# Patient Record
Sex: Female | Born: 1945
Health system: Southern US, Community
[De-identification: ages and names within clinical notes are randomized; demographics above are authoritative.]

## PROBLEM LIST (undated history)

## (undated) DIAGNOSIS — E039 Hypothyroidism, unspecified: Secondary | ICD-10-CM

## (undated) DIAGNOSIS — M810 Age-related osteoporosis without current pathological fracture: Secondary | ICD-10-CM

## (undated) DIAGNOSIS — E782 Mixed hyperlipidemia: Secondary | ICD-10-CM

## (undated) DIAGNOSIS — K2100 Gastro-esophageal reflux disease with esophagitis, without bleeding: Secondary | ICD-10-CM

## (undated) DIAGNOSIS — E038 Other specified hypothyroidism: Secondary | ICD-10-CM

## (undated) DIAGNOSIS — E1169 Type 2 diabetes mellitus with other specified complication: Secondary | ICD-10-CM

## (undated) HISTORY — DX: Type 2 diabetes mellitus with other specified complication: E11.69

## (undated) HISTORY — DX: Hypothyroidism, unspecified: E03.9

## (undated) HISTORY — DX: Other specified hypothyroidism: E03.8

## (undated) HISTORY — PX: CERVICAL DISC SURGERY: SHX588

## (undated) HISTORY — DX: Mixed hyperlipidemia: E78.2

## (undated) HISTORY — DX: Gastro-esophageal reflux disease with esophagitis, without bleeding: K21.00

## (undated) HISTORY — DX: Age-related osteoporosis without current pathological fracture: M81.0

## (undated) HISTORY — PX: PARTIAL HYSTERECTOMY: SHX80

---

## 2003-02-23 ENCOUNTER — Other Ambulatory Visit: Admission: RE | Admit: 2003-02-23 | Discharge: 2003-02-23 | Payer: Self-pay | Admitting: Obstetrics & Gynecology

## 2009-05-04 ENCOUNTER — Encounter: Admission: RE | Admit: 2009-05-04 | Discharge: 2009-05-04 | Payer: Self-pay | Admitting: Neurosurgery

## 2010-03-01 ENCOUNTER — Ambulatory Visit (HOSPITAL_COMMUNITY)
Admission: RE | Admit: 2010-03-01 | Discharge: 2010-03-01 | Disposition: A | Discharge status: Home / Self Care | Payer: Medicare Other | Source: Ambulatory Visit | Attending: Neurosurgery | Admitting: Neurosurgery

## 2010-03-01 ENCOUNTER — Encounter (HOSPITAL_COMMUNITY)
Admission: RE | Admit: 2010-03-01 | Discharge: 2010-03-01 | Disposition: A | Discharge status: Home / Self Care | Payer: Medicare Other | Source: Ambulatory Visit | Attending: Neurosurgery | Admitting: Neurosurgery

## 2010-03-01 ENCOUNTER — Other Ambulatory Visit: Payer: Self-pay | Admitting: Neurosurgery

## 2010-03-01 DIAGNOSIS — Z01811 Encounter for preprocedural respiratory examination: Secondary | ICD-10-CM

## 2010-03-01 DIAGNOSIS — Z01818 Encounter for other preprocedural examination: Secondary | ICD-10-CM | POA: Insufficient documentation

## 2010-03-01 LAB — CBC
HCT: 42.3 % (ref 36.0–46.0)
Hemoglobin: 14.1 g/dL (ref 12.0–15.0)
MCH: 30.9 pg (ref 26.0–34.0)
MCHC: 33.3 g/dL (ref 30.0–36.0)
MCV: 92.8 fL (ref 78.0–100.0)
Platelets: 186 10*3/uL (ref 150–400)
RBC: 4.56 MIL/uL (ref 3.87–5.11)
RDW: 13.4 % (ref 11.5–15.5)
WBC: 5 10*3/uL (ref 4.0–10.5)

## 2010-03-01 LAB — BASIC METABOLIC PANEL
BUN: 8 mg/dL (ref 6–23)
CO2: 30 mEq/L (ref 19–32)
Calcium: 9 mg/dL (ref 8.4–10.5)
Chloride: 107 mEq/L (ref 96–112)
Creatinine, Ser: 0.7 mg/dL (ref 0.4–1.2)
GFR calc Af Amer: >60 mL/min (ref >60)
GFR calc non Af Amer: >60 mL/min (ref >60)
Glucose, Bld: 97 mg/dL (ref 70–99)
Potassium: 3.2 mEq/L — ABNORMAL LOW (ref 3.5–5.1)
Sodium: 144 mEq/L (ref 135–145)

## 2010-03-01 LAB — SURGICAL PCR SCREEN
MRSA, PCR: NEGATIVE
Staphylococcus aureus: NEGATIVE

## 2010-03-03 ENCOUNTER — Inpatient Hospital Stay (HOSPITAL_COMMUNITY): Payer: Medicare Other

## 2010-03-03 ENCOUNTER — Inpatient Hospital Stay (HOSPITAL_COMMUNITY)
Admission: RE | Admit: 2010-03-03 | Discharge: 2010-03-04 | DRG: 473 | Disposition: A | Discharge status: Home / Self Care | Payer: Medicare Other | Source: Ambulatory Visit | Attending: Neurosurgery | Admitting: Neurosurgery

## 2010-03-03 DIAGNOSIS — M4712 Other spondylosis with myelopathy, cervical region: Principal | ICD-10-CM | POA: Diagnosis present

## 2010-03-03 DIAGNOSIS — F411 Generalized anxiety disorder: Secondary | ICD-10-CM | POA: Diagnosis present

## 2010-03-03 LAB — POTASSIUM: Potassium: 4.7 mEq/L (ref 3.5–5.1)

## 2010-03-10 NOTE — Op Note (Signed)
NAMEFRANKI, Kelly Horn                ACCOUNT NO.:  [id number]  MEDICAL RECORD NO.:  [id number]           PATIENT TYPE:  I  LOCATION:  3526                         FACILITY:  [facility]  PHYSICIAN:  Cristi Loron, M.D.DATE OF BIRTH:  [birth year]  DATE OF PROCEDURE:  03/03/2010 DATE OF DISCHARGE:                              OPERATIVE REPORT   BRIEF HISTORY:  The patient is a 65 year old white female who has suffered from chronic neck, shoulder, and arm pain.  She has failed medical management and was worked up with a cervical MRI which demonstrated the patient had spondylosis, stenosis etc., most prominent at C5-C6 and C6-C7.  I discussed the various treatment with the patient including surgery.  She has weighed the risks, benefits, and alternatives and decided to proceed with a C5-C6 and C6-C7 anterior cervical diskectomy, fusion, and plating.  PREOPERATIVE DIAGNOSES:  C5-C6 and C6-C7 spondylosis, stenosis, cervical radiculopathy status myelopathy, cervicalgia.  POSTOPERATIVE DIAGNOSES:  C5-C6 and C6-C7 spondylosis, stenosis, cervical radiculopathy status myelopathy, cervicalgia.  PROCEDURE:  C5-C6 and C6-C7 extensive anterior cervical diskectomy/decompression; C5-C6 and C6-C7 anterior interbody arthrodesis with local morselized autograft bone and Actifuse bone graft extender; insertion of C5-C6 and C6-C7 interbody prosthesis (Zimmer PEEK interbody prosthesis); anterior cervical plating with Globus expandable titanium plate and screws, C5-C7.  SURGEON:  Cristi Loron, MD  ASSISTANT:  Hilda Lias, MD  ANESTHESIA:  General endotracheal.  ESTIMATED BLOOD LOSS:  125 mL.  SPECIMENS:  None.  DRAINS:  None.  COMPLICATIONS:  None.  DESCRIPTION OF PROCEDURE:  The patient was brought to the operating room by the Anesthesia Team.  General endotracheal anesthesia was induced. The patient remained in supine position.  A roll was placed under the shoulders to place the neck  in a neutral position.  Her anterior cervical region was then prepared with Betadine scrub and Betadine solution.  Sterile drapes were applied.  I then injected the area to be incised with Marcaine with epinephrine solution.  I used scalpel to make a transverse incision in the patient's left anterior neck.  I used Metzenbaum scissors to divide the platysma muscle and to dissect the sternocleidomastoid muscle, jugular vein, and carotid artery.  I carefully dissected down towards the anterior cervical spine identifying the esophagus and retracting it medially.  I then used Kitner swabs to clear soft tissue from the anterior cervical spine.  We then inserted a bent spinal needle into the exposed intervertebral disk space.  We obtained intraoperative radiograph to determine our level.  I then used electrocautery to detach the medial border of the longus colli muscle bilaterally from C5-C6 and C6-C7 intervertebral disk space.  We then inserted the Caspar self-retaining retractor underneath the longus colli muscle bilaterally to provide exposure.  We began the decompression at C6-C7.  I incised the C6-C7 intervertebral disk with #15 blade scalpel. I performed a partial intervertebral diskectomy with the pituitary forceps and the Karlin curettes.  We then inserted distraction screws at C6 and C7 and distracted the interspace.  I then used high-speed drill to decorticate the vertebral endplates at C6-C7, drilled away the remainder of C6-C7 intervertebral disk  to drill away some posterior spondylosis and to thin out the posterior longitudinal ligament.  We then incised the ligament with the arachnoid knife and then removed it with a Kerrison punch undercutting the vertebral endplates and decompressing the thecal sac.  We then performed foraminotomies about the bilateral C7 nerve root completing the decompression at this level.  We then repeated the procedure in an analogous fashion at  C5-C6 decompressing the C5-C6 thecal sac and then bilateral C6 nerve root. This completed the decompression.  We now turned our attention to the arthrodesis.  We used the trial spacers and determined to use a 14-mm interbody prosthesis.  We prefilled the prosthesis with a combination of local and morselized autograft bone we obtained during decompression as well as Actifuse bone graft extender.  We inserted the prosthesis into the distracted C5-C6 and C6-C7 interspaces.  We then removed the distraction screws.  There was a good snug fit of the prosthesis at both levels.  We now turned our attention to the anterior spinal instrumentation.  We used high-speed drill to remove some ventral spondylosis from the vertebral endplates at C5-C6 and C6-C7, so the plate would lay down flat.  We selected the appropriate length titanium anterior cervical plate and laid it along the anterior aspect of the vertebral bodies from C5-C7.  We then drilled two 12 mm at C5, C6, and C7 and then used 12-mm self-tapping screws to secure the plate at the vertebral bodies at C5- C7.  We then obtained intraoperative radiograph which demonstrated good position of the plates, screws, and interbody prosthesis with limited visualization of lower plate and screws but everything looked good in vivo.  We therefore secured the screws and plate by locking each cam. This completed the instrumentation.  We then irrigated the wound out with bacitracin solution.  We removed the retractor.  We inspected the esophagus for any damage, there was none apparent.  We then reapproximated the patient's platysma muscle with interrupted 3-0 Vicryl suture, subcutaneous tissue with interrupted 3-0 Vicryl suture, and the skin with Steri-Strips and Benzoin.  The wound was then coated with bacitracin ointment.  Sterile dressings were applied.  The drapes were removed.  The patient was subsequently extubated by the Anesthesia Team and  transported to Postanesthesia Care Unit in stable condition.  All sponge, instrument, and needle counts were correct at the end of this case.     Cristi Loron, M.D.     JDJ/MEDQ  D:  03/03/2010  T:  03/04/2010  Job:  [id number]  Electronically Signed by Tressie Stalker M.D. on 03/10/2010 12:15:55 PM

## 2016-03-01 DIAGNOSIS — R05 Cough: Secondary | ICD-10-CM | POA: Diagnosis not present

## 2016-03-08 DIAGNOSIS — L570 Actinic keratosis: Secondary | ICD-10-CM | POA: Diagnosis not present

## 2016-03-08 DIAGNOSIS — L821 Other seborrheic keratosis: Secondary | ICD-10-CM | POA: Diagnosis not present

## 2016-03-08 DIAGNOSIS — C44529 Squamous cell carcinoma of skin of other part of trunk: Secondary | ICD-10-CM | POA: Diagnosis not present

## 2016-03-08 DIAGNOSIS — L578 Other skin changes due to chronic exposure to nonionizing radiation: Secondary | ICD-10-CM | POA: Diagnosis not present

## 2016-03-27 DIAGNOSIS — J441 Chronic obstructive pulmonary disease with (acute) exacerbation: Secondary | ICD-10-CM | POA: Diagnosis not present

## 2016-03-27 DIAGNOSIS — J069 Acute upper respiratory infection, unspecified: Secondary | ICD-10-CM | POA: Diagnosis not present

## 2016-04-25 DIAGNOSIS — E038 Other specified hypothyroidism: Secondary | ICD-10-CM | POA: Diagnosis not present

## 2016-04-25 DIAGNOSIS — R7303 Prediabetes: Secondary | ICD-10-CM | POA: Diagnosis not present

## 2016-04-25 DIAGNOSIS — Z6826 Body mass index (BMI) 26.0-26.9, adult: Secondary | ICD-10-CM | POA: Diagnosis not present

## 2016-04-25 DIAGNOSIS — Z0000 Encounter for general adult medical examination without abnormal findings: Secondary | ICD-10-CM | POA: Diagnosis not present

## 2016-04-25 DIAGNOSIS — E782 Mixed hyperlipidemia: Secondary | ICD-10-CM | POA: Diagnosis not present

## 2016-04-25 DIAGNOSIS — E039 Hypothyroidism, unspecified: Secondary | ICD-10-CM | POA: Diagnosis not present

## 2016-06-13 DIAGNOSIS — J40 Bronchitis, not specified as acute or chronic: Secondary | ICD-10-CM | POA: Diagnosis not present

## 2016-06-13 DIAGNOSIS — J158 Pneumonia due to other specified bacteria: Secondary | ICD-10-CM | POA: Diagnosis not present

## 2016-06-13 DIAGNOSIS — J180 Bronchopneumonia, unspecified organism: Secondary | ICD-10-CM | POA: Diagnosis not present

## 2016-06-15 ENCOUNTER — Ambulatory Visit: Payer: Self-pay | Admitting: Family Medicine

## 2016-06-19 DIAGNOSIS — L570 Actinic keratosis: Secondary | ICD-10-CM | POA: Diagnosis not present

## 2016-07-10 DIAGNOSIS — Z1231 Encounter for screening mammogram for malignant neoplasm of breast: Secondary | ICD-10-CM | POA: Diagnosis not present

## 2016-07-10 DIAGNOSIS — Z6824 Body mass index (BMI) 24.0-24.9, adult: Secondary | ICD-10-CM | POA: Diagnosis not present

## 2016-07-10 DIAGNOSIS — Z01419 Encounter for gynecological examination (general) (routine) without abnormal findings: Secondary | ICD-10-CM | POA: Diagnosis not present

## 2016-07-19 DIAGNOSIS — L0390 Cellulitis, unspecified: Secondary | ICD-10-CM | POA: Diagnosis not present

## 2016-07-19 DIAGNOSIS — S81811A Laceration without foreign body, right lower leg, initial encounter: Secondary | ICD-10-CM | POA: Diagnosis not present

## 2016-07-19 DIAGNOSIS — L03116 Cellulitis of left lower limb: Secondary | ICD-10-CM | POA: Diagnosis not present

## 2016-07-28 DIAGNOSIS — E782 Mixed hyperlipidemia: Secondary | ICD-10-CM | POA: Diagnosis not present

## 2016-07-28 DIAGNOSIS — K219 Gastro-esophageal reflux disease without esophagitis: Secondary | ICD-10-CM | POA: Diagnosis not present

## 2016-07-28 DIAGNOSIS — R7303 Prediabetes: Secondary | ICD-10-CM | POA: Diagnosis not present

## 2016-07-28 DIAGNOSIS — E038 Other specified hypothyroidism: Secondary | ICD-10-CM | POA: Diagnosis not present

## 2016-08-09 DIAGNOSIS — N959 Unspecified menopausal and perimenopausal disorder: Secondary | ICD-10-CM | POA: Diagnosis not present

## 2016-08-09 DIAGNOSIS — M8580 Other specified disorders of bone density and structure, unspecified site: Secondary | ICD-10-CM | POA: Diagnosis not present

## 2016-08-09 DIAGNOSIS — M85832 Other specified disorders of bone density and structure, left forearm: Secondary | ICD-10-CM | POA: Diagnosis not present

## 2016-11-03 DIAGNOSIS — K219 Gastro-esophageal reflux disease without esophagitis: Secondary | ICD-10-CM | POA: Diagnosis not present

## 2016-11-03 DIAGNOSIS — R197 Diarrhea, unspecified: Secondary | ICD-10-CM | POA: Diagnosis not present

## 2016-11-03 DIAGNOSIS — H6503 Acute serous otitis media, bilateral: Secondary | ICD-10-CM | POA: Diagnosis not present

## 2016-11-09 DIAGNOSIS — L7212 Trichodermal cyst: Secondary | ICD-10-CM | POA: Diagnosis not present

## 2016-11-09 DIAGNOSIS — L7211 Pilar cyst: Secondary | ICD-10-CM | POA: Diagnosis not present

## 2016-11-14 DIAGNOSIS — L290 Pruritus ani: Secondary | ICD-10-CM | POA: Diagnosis not present

## 2016-11-27 DIAGNOSIS — H524 Presbyopia: Secondary | ICD-10-CM | POA: Diagnosis not present

## 2016-11-27 DIAGNOSIS — H53022 Refractive amblyopia, left eye: Secondary | ICD-10-CM | POA: Diagnosis not present

## 2016-11-27 DIAGNOSIS — H52221 Regular astigmatism, right eye: Secondary | ICD-10-CM | POA: Diagnosis not present

## 2016-12-18 DIAGNOSIS — L578 Other skin changes due to chronic exposure to nonionizing radiation: Secondary | ICD-10-CM | POA: Diagnosis not present

## 2016-12-18 DIAGNOSIS — L570 Actinic keratosis: Secondary | ICD-10-CM | POA: Diagnosis not present

## 2016-12-18 DIAGNOSIS — L821 Other seborrheic keratosis: Secondary | ICD-10-CM | POA: Diagnosis not present

## 2017-01-23 DIAGNOSIS — H6990 Unspecified Eustachian tube disorder, unspecified ear: Secondary | ICD-10-CM | POA: Diagnosis not present

## 2017-01-23 DIAGNOSIS — J0100 Acute maxillary sinusitis, unspecified: Secondary | ICD-10-CM | POA: Diagnosis not present

## 2017-02-09 DIAGNOSIS — H903 Sensorineural hearing loss, bilateral: Secondary | ICD-10-CM | POA: Diagnosis not present

## 2017-02-09 DIAGNOSIS — R51 Headache: Secondary | ICD-10-CM | POA: Diagnosis not present

## 2017-02-09 DIAGNOSIS — D219 Benign neoplasm of connective and other soft tissue, unspecified: Secondary | ICD-10-CM | POA: Diagnosis not present

## 2017-02-09 DIAGNOSIS — H9319 Tinnitus, unspecified ear: Secondary | ICD-10-CM | POA: Diagnosis not present

## 2017-02-09 DIAGNOSIS — Z9889 Other specified postprocedural states: Secondary | ICD-10-CM | POA: Diagnosis not present

## 2017-03-13 DIAGNOSIS — Z6826 Body mass index (BMI) 26.0-26.9, adult: Secondary | ICD-10-CM | POA: Diagnosis not present

## 2017-03-13 DIAGNOSIS — K219 Gastro-esophageal reflux disease without esophagitis: Secondary | ICD-10-CM | POA: Diagnosis not present

## 2017-03-13 DIAGNOSIS — Z0000 Encounter for general adult medical examination without abnormal findings: Secondary | ICD-10-CM | POA: Diagnosis not present

## 2017-03-13 DIAGNOSIS — R7303 Prediabetes: Secondary | ICD-10-CM | POA: Diagnosis not present

## 2017-03-13 DIAGNOSIS — E038 Other specified hypothyroidism: Secondary | ICD-10-CM | POA: Diagnosis not present

## 2017-03-13 DIAGNOSIS — E782 Mixed hyperlipidemia: Secondary | ICD-10-CM | POA: Diagnosis not present

## 2017-03-16 DIAGNOSIS — E119 Type 2 diabetes mellitus without complications: Secondary | ICD-10-CM | POA: Diagnosis not present

## 2017-04-24 DIAGNOSIS — L988 Other specified disorders of the skin and subcutaneous tissue: Secondary | ICD-10-CM | POA: Diagnosis not present

## 2017-04-24 DIAGNOSIS — Z6825 Body mass index (BMI) 25.0-25.9, adult: Secondary | ICD-10-CM | POA: Diagnosis not present

## 2017-05-04 DIAGNOSIS — Z1211 Encounter for screening for malignant neoplasm of colon: Secondary | ICD-10-CM | POA: Diagnosis not present

## 2017-05-04 DIAGNOSIS — K635 Polyp of colon: Secondary | ICD-10-CM | POA: Diagnosis not present

## 2017-05-04 DIAGNOSIS — D125 Benign neoplasm of sigmoid colon: Secondary | ICD-10-CM | POA: Diagnosis not present

## 2017-05-04 DIAGNOSIS — Z86010 Personal history of colonic polyps: Secondary | ICD-10-CM | POA: Diagnosis not present

## 2017-05-04 LAB — HM COLONOSCOPY

## 2017-06-18 DIAGNOSIS — J018 Other acute sinusitis: Secondary | ICD-10-CM | POA: Diagnosis not present

## 2017-06-25 DIAGNOSIS — L570 Actinic keratosis: Secondary | ICD-10-CM | POA: Diagnosis not present

## 2017-06-25 DIAGNOSIS — L821 Other seborrheic keratosis: Secondary | ICD-10-CM | POA: Diagnosis not present

## 2017-06-25 DIAGNOSIS — L578 Other skin changes due to chronic exposure to nonionizing radiation: Secondary | ICD-10-CM | POA: Diagnosis not present

## 2017-06-25 DIAGNOSIS — L720 Epidermal cyst: Secondary | ICD-10-CM | POA: Diagnosis not present

## 2017-07-16 DIAGNOSIS — E038 Other specified hypothyroidism: Secondary | ICD-10-CM | POA: Diagnosis not present

## 2017-07-16 DIAGNOSIS — E1169 Type 2 diabetes mellitus with other specified complication: Secondary | ICD-10-CM | POA: Diagnosis not present

## 2017-07-16 DIAGNOSIS — E782 Mixed hyperlipidemia: Secondary | ICD-10-CM | POA: Diagnosis not present

## 2017-07-16 DIAGNOSIS — K210 Gastro-esophageal reflux disease with esophagitis: Secondary | ICD-10-CM | POA: Diagnosis not present

## 2017-07-31 DIAGNOSIS — Z6823 Body mass index (BMI) 23.0-23.9, adult: Secondary | ICD-10-CM | POA: Diagnosis not present

## 2017-07-31 DIAGNOSIS — Z1231 Encounter for screening mammogram for malignant neoplasm of breast: Secondary | ICD-10-CM | POA: Diagnosis not present

## 2017-07-31 DIAGNOSIS — Z01419 Encounter for gynecological examination (general) (routine) without abnormal findings: Secondary | ICD-10-CM | POA: Diagnosis not present

## 2017-07-31 LAB — HM MAMMOGRAPHY

## 2017-11-20 DIAGNOSIS — K210 Gastro-esophageal reflux disease with esophagitis: Secondary | ICD-10-CM | POA: Diagnosis not present

## 2017-11-20 DIAGNOSIS — E1169 Type 2 diabetes mellitus with other specified complication: Secondary | ICD-10-CM | POA: Diagnosis not present

## 2017-11-20 DIAGNOSIS — Z6824 Body mass index (BMI) 24.0-24.9, adult: Secondary | ICD-10-CM | POA: Diagnosis not present

## 2017-11-20 DIAGNOSIS — M810 Age-related osteoporosis without current pathological fracture: Secondary | ICD-10-CM | POA: Diagnosis not present

## 2017-11-20 DIAGNOSIS — E038 Other specified hypothyroidism: Secondary | ICD-10-CM | POA: Diagnosis not present

## 2017-11-20 DIAGNOSIS — E782 Mixed hyperlipidemia: Secondary | ICD-10-CM | POA: Diagnosis not present

## 2017-12-25 DIAGNOSIS — H52221 Regular astigmatism, right eye: Secondary | ICD-10-CM | POA: Diagnosis not present

## 2017-12-25 DIAGNOSIS — H2513 Age-related nuclear cataract, bilateral: Secondary | ICD-10-CM | POA: Diagnosis not present

## 2017-12-25 DIAGNOSIS — H53022 Refractive amblyopia, left eye: Secondary | ICD-10-CM | POA: Diagnosis not present

## 2017-12-27 DIAGNOSIS — L0231 Cutaneous abscess of buttock: Secondary | ICD-10-CM | POA: Diagnosis not present

## 2017-12-27 DIAGNOSIS — L0390 Cellulitis, unspecified: Secondary | ICD-10-CM | POA: Diagnosis not present

## 2018-01-14 DIAGNOSIS — E038 Other specified hypothyroidism: Secondary | ICD-10-CM | POA: Diagnosis not present

## 2018-01-29 DIAGNOSIS — L720 Epidermal cyst: Secondary | ICD-10-CM | POA: Diagnosis not present

## 2018-01-29 DIAGNOSIS — L821 Other seborrheic keratosis: Secondary | ICD-10-CM | POA: Diagnosis not present

## 2018-01-29 DIAGNOSIS — L570 Actinic keratosis: Secondary | ICD-10-CM | POA: Diagnosis not present

## 2018-01-29 DIAGNOSIS — L578 Other skin changes due to chronic exposure to nonionizing radiation: Secondary | ICD-10-CM | POA: Diagnosis not present

## 2018-02-18 DIAGNOSIS — E038 Other specified hypothyroidism: Secondary | ICD-10-CM | POA: Diagnosis not present

## 2018-04-01 DIAGNOSIS — H4302 Vitreous prolapse, left eye: Secondary | ICD-10-CM | POA: Diagnosis not present

## 2018-04-01 DIAGNOSIS — H2513 Age-related nuclear cataract, bilateral: Secondary | ICD-10-CM | POA: Diagnosis not present

## 2018-05-06 DIAGNOSIS — Z0000 Encounter for general adult medical examination without abnormal findings: Secondary | ICD-10-CM | POA: Diagnosis not present

## 2018-06-04 DIAGNOSIS — Z6824 Body mass index (BMI) 24.0-24.9, adult: Secondary | ICD-10-CM | POA: Diagnosis not present

## 2018-06-04 DIAGNOSIS — K210 Gastro-esophageal reflux disease with esophagitis: Secondary | ICD-10-CM | POA: Diagnosis not present

## 2018-06-04 DIAGNOSIS — E038 Other specified hypothyroidism: Secondary | ICD-10-CM | POA: Diagnosis not present

## 2018-06-04 DIAGNOSIS — M810 Age-related osteoporosis without current pathological fracture: Secondary | ICD-10-CM | POA: Diagnosis not present

## 2018-06-04 DIAGNOSIS — Z1159 Encounter for screening for other viral diseases: Secondary | ICD-10-CM | POA: Diagnosis not present

## 2018-06-04 DIAGNOSIS — E1169 Type 2 diabetes mellitus with other specified complication: Secondary | ICD-10-CM | POA: Diagnosis not present

## 2018-06-04 DIAGNOSIS — E782 Mixed hyperlipidemia: Secondary | ICD-10-CM | POA: Diagnosis not present

## 2018-06-19 DIAGNOSIS — M85861 Other specified disorders of bone density and structure, right lower leg: Secondary | ICD-10-CM | POA: Diagnosis not present

## 2018-06-19 DIAGNOSIS — M85851 Other specified disorders of bone density and structure, right thigh: Secondary | ICD-10-CM | POA: Diagnosis not present

## 2018-06-19 DIAGNOSIS — M810 Age-related osteoporosis without current pathological fracture: Secondary | ICD-10-CM | POA: Diagnosis not present

## 2018-07-26 DIAGNOSIS — L578 Other skin changes due to chronic exposure to nonionizing radiation: Secondary | ICD-10-CM | POA: Diagnosis not present

## 2018-07-26 DIAGNOSIS — L821 Other seborrheic keratosis: Secondary | ICD-10-CM | POA: Diagnosis not present

## 2018-07-26 DIAGNOSIS — L570 Actinic keratosis: Secondary | ICD-10-CM | POA: Diagnosis not present

## 2018-09-09 DIAGNOSIS — Z6823 Body mass index (BMI) 23.0-23.9, adult: Secondary | ICD-10-CM | POA: Diagnosis not present

## 2018-09-09 DIAGNOSIS — Z01419 Encounter for gynecological examination (general) (routine) without abnormal findings: Secondary | ICD-10-CM | POA: Diagnosis not present

## 2018-09-09 DIAGNOSIS — Z1231 Encounter for screening mammogram for malignant neoplasm of breast: Secondary | ICD-10-CM | POA: Diagnosis not present

## 2019-01-21 DIAGNOSIS — H43812 Vitreous degeneration, left eye: Secondary | ICD-10-CM | POA: Diagnosis not present

## 2019-01-21 DIAGNOSIS — H353111 Nonexudative age-related macular degeneration, right eye, early dry stage: Secondary | ICD-10-CM | POA: Diagnosis not present

## 2019-01-21 DIAGNOSIS — H2513 Age-related nuclear cataract, bilateral: Secondary | ICD-10-CM | POA: Diagnosis not present

## 2019-02-19 ENCOUNTER — Other Ambulatory Visit: Payer: Self-pay | Admitting: Legal Medicine

## 2019-06-02 ENCOUNTER — Encounter: Payer: Self-pay | Admitting: Legal Medicine

## 2019-06-02 DIAGNOSIS — E039 Hypothyroidism, unspecified: Secondary | ICD-10-CM | POA: Insufficient documentation

## 2019-06-02 DIAGNOSIS — M810 Age-related osteoporosis without current pathological fracture: Secondary | ICD-10-CM | POA: Insufficient documentation

## 2019-06-02 DIAGNOSIS — E1169 Type 2 diabetes mellitus with other specified complication: Secondary | ICD-10-CM | POA: Insufficient documentation

## 2019-06-02 DIAGNOSIS — K2100 Gastro-esophageal reflux disease with esophagitis, without bleeding: Secondary | ICD-10-CM | POA: Insufficient documentation

## 2019-06-02 DIAGNOSIS — E782 Mixed hyperlipidemia: Secondary | ICD-10-CM | POA: Insufficient documentation

## 2019-06-03 ENCOUNTER — Encounter: Payer: Self-pay | Admitting: Legal Medicine

## 2019-06-03 ENCOUNTER — Other Ambulatory Visit: Payer: Self-pay

## 2019-06-03 ENCOUNTER — Ambulatory Visit (INDEPENDENT_AMBULATORY_CARE_PROVIDER_SITE_OTHER): Payer: PPO | Admitting: Legal Medicine

## 2019-06-03 DIAGNOSIS — Z780 Asymptomatic menopausal state: Secondary | ICD-10-CM

## 2019-06-03 DIAGNOSIS — Z6824 Body mass index (BMI) 24.0-24.9, adult: Secondary | ICD-10-CM | POA: Insufficient documentation

## 2019-06-03 DIAGNOSIS — E782 Mixed hyperlipidemia: Secondary | ICD-10-CM

## 2019-06-03 DIAGNOSIS — M810 Age-related osteoporosis without current pathological fracture: Secondary | ICD-10-CM | POA: Diagnosis not present

## 2019-06-03 DIAGNOSIS — E1169 Type 2 diabetes mellitus with other specified complication: Secondary | ICD-10-CM

## 2019-06-03 DIAGNOSIS — E038 Other specified hypothyroidism: Secondary | ICD-10-CM

## 2019-06-03 DIAGNOSIS — K2100 Gastro-esophageal reflux disease with esophagitis, without bleeding: Secondary | ICD-10-CM | POA: Diagnosis not present

## 2019-06-03 HISTORY — DX: Asymptomatic menopausal state: Z78.0

## 2019-06-03 HISTORY — DX: Body mass index (BMI) 24.0-24.9, adult: Z68.24

## 2019-06-03 LAB — POCT UA - MICROALBUMIN: Microalbumin Ur, POC: 30 mg/L

## 2019-06-03 MED ORDER — LEVOTHYROXINE SODIUM 50 MCG PO TABS: 50.0000 ug | ORAL_TABLET | Freq: Every day | ORAL | 2 refills | Status: DC

## 2019-06-03 MED ORDER — SIMVASTATIN 20 MG PO TABS: 20.0000 mg | ORAL_TABLET | Freq: Every day | ORAL | 2 refills | Status: DC

## 2019-06-03 NOTE — Progress Notes (Signed)
Established Patient Office Visit  Subjective:  Patient ID: Kelly Horn, female    DOB: 09/26/1945  Age: 74 y.o. MRN: [id number]  CC:  Chief Complaint  Patient presents with  . Hyperlipidemia  . Hypothyroidism  . Gastroesophageal Reflux  . Diabetes    HPI Pura Spice presents for chronic visit.  Patient presents with hyperlipidemia.  Compliance with treatment has been good; patient takes medicines as directed, maintains low cholesterol diet, follows up as directed, and maintains exercise regimen.  Patient is using none without problems.  Patient present with type 2 diabetes.  Specifically, this is type 2, nonnsulin requiring diabetes, complicated by hypertension and hypercholesteroemi.  Compliance with treatment has been good; patient take medicines as directed, maintains diet and exercise regimen, follows up as directed, and is keeping glucose diary.  Date of  diagnosis 2010.  Depression screen has been performed.Tobacco screen nonsmoker. Current medicines for diabetes die tand exercise.  Patient is on nothing for renal protection and simvastatin for cholesterol control.  Patient performs foot exams daily and last ophthalmologic exam was Dr Renaldo Fiddler in dec 2020.  Patient has gastroesophageal reflux symptoms withoutesophagitis and LTRD.  The symptoms are mild intensity.  Length of symptoms 5 years.  Medicines include OTC medicines.  Complications include none  Patient has HYPOTHYROIDISM.  Diagnosed 60 years ago.  Patient has stable thyroid readings.  Patient is having less energy.  Last TSH was na.  continue dosage of thyroid medicine..  Past Medical History:  Diagnosis Date  . Age-related osteoporosis without current pathological fracture   . GERD with esophagitis   . Mixed hyperlipidemia   . Other specified hypothyroidism   . Type 2 diabetes mellitus with other specified complication [facility])     Past Surgical History:  Procedure Laterality Date  . CERVICAL DISC SURGERY    .  PARTIAL HYSTERECTOMY      Family History  Problem Relation Age of Onset  . Cancer Mother   . Heart attack Father   . Diabetes Other   . Arthritis Other     Social History   Socioeconomic History  . Marital status: Married    Spouse name: Not on file  . Number of children: 2  . Years of education: Not on file  . Highest education level: Not on file  Occupational History  . Occupation: retired  Tobacco Use  . Smoking status: Never Smoker  . Smokeless tobacco: Never Used  Substance and Sexual Activity  . Alcohol use: Not on file  . Drug use: Not on file  . Sexual activity: Not on file  Other Topics Concern  . Not on file  Social History Narrative  . Not on file   Social Determinants of Health   Financial Resource Strain:   . Difficulty of Paying Living Expenses:   Food Insecurity:   . Worried About Charity fundraiser in the Last Year:   . Arboriculturist in the Last Year:   Transportation Needs:   . Film/video editor (Medical):   Marland Kitchen Lack of Transportation (Non-Medical):   Physical Activity:   . Days of Exercise per Week:   . Minutes of Exercise per Session:   Stress:   . Feeling of Stress :   Social Connections:   . Frequency of Communication with Friends and Family:   . Frequency of Social Gatherings with Friends and Family:   . Attends Religious Services:   . Active Member of Clubs or Organizations:   .  Attends Archivist Meetings:   Marland Kitchen Marital Status:   Intimate Partner Violence:   . Fear of Current or Ex-Partner:   . Emotionally Abused:   Marland Kitchen Physically Abused:   . Sexually Abused:     Outpatient Medications Prior to Visit  Medication Sig Dispense Refill  . levothyroxine (SYNTHROID) 50 MCG tablet TAKE 1 TABLET BY MOUTH DAILY 90 tablet 2  . simvastatin (ZOCOR) 20 MG tablet Take 20 mg by mouth daily.     No facility-administered medications prior to visit.    Allergies  Allergen Reactions  . Cimetidine     ROS Review of Systems   Constitutional: Negative.   HENT: Negative.   Eyes: Negative.   Respiratory: Negative.   Cardiovascular: Negative.   Gastrointestinal: Negative.   Endocrine: Negative.   Genitourinary: Negative.   Musculoskeletal: Negative.   Skin: Negative.   Neurological: Negative.   Hematological: Negative.   Psychiatric/Behavioral: Negative.       Objective:    Physical Exam  Constitutional: She is oriented to person, place, and time. She appears well-developed and well-nourished.  HENT:  Head: Normocephalic and atraumatic.  Right Ear: External ear normal.  Left Ear: External ear normal.  Nose: Nose normal.  Mouth/Throat: Oropharynx is clear and moist.  Eyes: Conjunctivae and EOM are normal.  Cardiovascular: Normal rate, regular rhythm, normal heart sounds and intact distal pulses.  Pulmonary/Chest: Effort normal and breath sounds normal.  Abdominal: Soft. Bowel sounds are normal.  Musculoskeletal:     Cervical back: Normal range of motion and neck supple.  Neurological: She is alert and oriented to person, place, and time.  Skin: Skin is warm.  Psychiatric: She has a normal mood and affect.  Vitals reviewed.   BP 130/70   Pulse 78   Temp (!) 97.2 F (36.2 C)   Resp 16   Ht 5\' 1"  (1.549 m)   Wt 132 lb (59.9 kg)   SpO2 96%   BMI 24.94 kg/m  Wt Readings from Last 3 Encounters:  06/03/19 132 lb (59.9 kg)     Health Maintenance Due  Topic Date Due  . HEMOGLOBIN A1C  Never done  . Hepatitis C Screening  Never done  . TETANUS/TDAP  Never done  . COLONOSCOPY  Never done  . PNA vac Low Risk Adult (1 of 2 - PCV13) Never done    There are no preventive care reminders to display for this patient.  No results found for: TSH Lab Results  Component Value Date   WBC 5.0 03/01/2010   HGB 14.1 03/01/2010   HCT 42.3 03/01/2010   MCV 92.8 03/01/2010   PLT 186 03/01/2010   Lab Results  Component Value Date   NA 144 03/01/2010   K 4.7 03/03/2010   CO2 30 03/01/2010    GLUCOSE 97 03/01/2010   BUN 8 03/01/2010   CREATININE 0.70 03/01/2010   CALCIUM 9.0 03/01/2010   No results found for: CHOL No results found for: HDL No results found for: LDLCALC No results found for: TRIG No results found for: CHOLHDL No results found for: HGBA1C    Assessment & Plan:   Problem List Items Addressed This Visit      Digestive   GERD with esophagitis    Plan of care was formulated today.  She is doing well.  A plan of care was formulated using patient exam, tests and other sources to optimize care using evidence based information.  Recommend no smoking, no eating after supper,  avoid fatty foods, elevate Head of bed, avoid tight fitting clothing.  Continue on OTC.        Endocrine   Primary hypothyroidism    Patient is known to have hypothyroid and is in treatment with levothroid 50 mcg.  Patient was diagnosed 50 years ago.  Other treatment includes none.  Patient is compliant with medicines and last TSH 6 months ago.       Relevant Medications   levothyroxine (SYNTHROID) 50 MCG tablet   Type 2 diabetes mellitus with other specified complication [facility])    An individual care plan for diabetes was established and reinforced today.  The patient's status was assessed using clinical findings on exam, labs and diagnostic testing. Patient success at meeting goals based on disease specific evidence-based guidelines and found to be good controlled. Medications were assessed and patient's understanding of the medical issues , including barriers were assessed. Recommend adherence to a diabetic diet, a graduated exercise program, HgbA1c level is checked quarterly, and urine microalbumin performed yearly .  Annual mono-filament sensation testing performed. Lower blood pressure and control hyperlipidemia is important. Get annual eye exams and annual flu shots and smoking cessation discussed.  Self management goals were discussed.      Relevant Medications   simvastatin (ZOCOR) 20  MG tablet   Other Relevant Orders   Comprehensive metabolic panel   CBC with Differential/Platelet   Hemoglobin A1c   POCT UA - Microalbumin (Completed)     Musculoskeletal and Integument   Age-related osteoporosis without current pathological fracture    AN INDIVIDUAL CARE PLAN was established and reinforced today.  The patient's status was assessed using clinical findings on exam, labs, and other diagnostic testing. Patient's success at meeting treatment goals based on disease specific evidence-bassed guidelines and found to be in good control. RECOMMENDATIONS include maintaining present medicines and treatment.        Other   Mixed hyperlipidemia    AN INDIVIDUAL CARE PLAN for hyperlipidemia/ cholesterol was established and reinforced today.  The patient's status was assessed using clinical findings on exam, lab and other diagnostic tests. The patient's disease status was assessed based on evidence-based guidelines and found to be well controlled. MEDICATIONS were reviewed. SELF MANAGEMENT GOALS have been discussed and patient's success at attaining the goal of low cholesterol was assessed. RECOMMENDATION given include regular exercise 3 days a week and low cholesterol/low fat diet. CLINICAL SUMMARY including written plan to identify barriers unique to the patient due to social or economic  reasons was discussed.      Relevant Medications   simvastatin (ZOCOR) 20 MG tablet   Other Relevant Orders   Lipid panel   BMI 24.0-24.9, adult    Weight stable, continue exercise and diet         Meds ordered this encounter  Medications  . simvastatin (ZOCOR) 20 MG tablet    Sig: Take 1 tablet (20 mg total) by mouth daily.    Dispense:  90 tablet    Refill:  2  . levothyroxine (SYNTHROID) 50 MCG tablet    Sig: Take 1 tablet (50 mcg total) by mouth daily.    Dispense:  90 tablet    Refill:  2    Follow-up: Return in about 6 months (around 12/04/2019) for fasting.    Reinaldo Meeker, MD

## 2019-06-03 NOTE — Assessment & Plan Note (Signed)
Weight stable, continue exercise and diet

## 2019-06-03 NOTE — Assessment & Plan Note (Signed)
Patient is known to have hypothyroid and is in treatment with levothroid 50 mcg.  Patient was diagnosed 50 years ago.  Other treatment includes none.  Patient is compliant with medicines and last TSH 6 months ago.

## 2019-06-03 NOTE — Assessment & Plan Note (Signed)

## 2019-06-03 NOTE — Assessment & Plan Note (Signed)
Plan of care was formulated today.  She is doing well.  A plan of care was formulated using patient exam, tests and other sources to optimize care using evidence based information.  Recommend no smoking, no eating after supper, avoid fatty foods, elevate Head of bed, avoid tight fitting clothing.  Continue on OTC. 

## 2019-06-03 NOTE — Assessment & Plan Note (Signed)
An individual care plan for diabetes was established and reinforced today.  The patient's status was assessed using clinical findings on exam, labs and diagnostic testing. Patient success at meeting goals based on disease specific evidence-based guidelines and found to be good controlled. Medications were assessed and patient's understanding of the medical issues , including barriers were assessed. Recommend adherence to a diabetic diet, a graduated exercise program, HgbA1c level is checked quarterly, and urine microalbumin performed yearly .  Annual mono-filament sensation testing performed. Lower blood pressure and control hyperlipidemia is important. Get annual eye exams and annual flu shots and smoking cessation discussed.  Self management goals were discussed. 

## 2019-06-03 NOTE — Assessment & Plan Note (Signed)
AN INDIVIDUAL CARE PLAN was established and reinforced today.  The patient's status was assessed using clinical findings on exam, labs, and other diagnostic testing. Patient's success at meeting treatment goals based on disease specific evidence-bassed guidelines and found to be in good control. RECOMMENDATIONS include maintaining present medicines and treatment. 

## 2019-06-04 LAB — CBC WITH DIFFERENTIAL/PLATELET
Basophils Absolute: 0 10*3/uL (ref 0.0–0.2)
Basos: 0 %
EOS (ABSOLUTE): 0.2 10*3/uL (ref 0.0–0.4)
Eos: 4 %
Hematocrit: 42.9 % (ref 34.0–46.6)
Hemoglobin: 14 g/dL (ref 11.1–15.9)
Immature Grans (Abs): 0 10*3/uL (ref 0.0–0.1)
Immature Granulocytes: 0 %
Lymphocytes Absolute: 2.1 10*3/uL (ref 0.7–3.1)
Lymphs: 41 %
MCH: 30 pg (ref 26.6–33.0)
MCHC: 32.6 g/dL (ref 31.5–35.7)
MCV: 92 fL (ref 79–97)
Monocytes Absolute: 0.3 10*3/uL (ref 0.1–0.9)
Monocytes: 6 %
Neutrophils Absolute: 2.4 10*3/uL (ref 1.4–7.0)
Neutrophils: 49 %
Platelets: 202 10*3/uL (ref 150–450)
RBC: 4.66 x10E6/uL (ref 3.77–5.28)
RDW: 12.7 % (ref 11.7–15.4)
WBC: 5 10*3/uL (ref 3.4–10.8)

## 2019-06-04 LAB — COMPREHENSIVE METABOLIC PANEL
ALT: 17 IU/L (ref 0–32)
AST: 21 IU/L (ref 0–40)
Albumin/Globulin Ratio: 1.7 (ref 1.2–2.2)
Albumin: 4.4 g/dL (ref 3.7–4.7)
Alkaline Phosphatase: 64 IU/L (ref 48–121)
BUN/Creatinine Ratio: 15 (ref 12–28)
BUN: 13 mg/dL (ref 8–27)
Bilirubin Total: 0.5 mg/dL (ref 0.0–1.2)
CO2: 24 mmol/L (ref 20–29)
Calcium: 9.4 mg/dL (ref 8.7–10.3)
Chloride: 103 mmol/L (ref 96–106)
Creatinine, Ser: 0.85 mg/dL (ref 0.57–1.00)
GFR calc Af Amer: 78 mL/min/{1.73_m2} (ref >59)
GFR calc non Af Amer: 67 mL/min/{1.73_m2} (ref >59)
Globulin, Total: 2.6 g/dL (ref 1.5–4.5)
Glucose: 106 mg/dL — ABNORMAL HIGH (ref 65–99)
Potassium: 4.1 mmol/L (ref 3.5–5.2)
Sodium: 142 mmol/L (ref 134–144)
Total Protein: 7 g/dL (ref 6.0–8.5)

## 2019-06-04 LAB — LIPID PANEL
Chol/HDL Ratio: 2.9 ratio (ref 0.0–4.4)
Cholesterol, Total: 166 mg/dL (ref 100–199)
HDL: 58 mg/dL (ref >39)
LDL Chol Calc (NIH): 88 mg/dL (ref 0–99)
Triglycerides: 111 mg/dL (ref 0–149)
VLDL Cholesterol Cal: 20 mg/dL (ref 5–40)

## 2019-06-04 LAB — TSH: TSH: 3.49 u[IU]/mL (ref 0.450–4.500)

## 2019-06-04 LAB — HEMOGLOBIN A1C
Est. average glucose Bld gHb Est-mCnc: 120 mg/dL
Hgb A1c MFr Bld: 5.8 % — ABNORMAL HIGH (ref 4.8–5.6)

## 2019-06-04 LAB — CARDIOVASCULAR RISK ASSESSMENT

## 2019-06-04 NOTE — Progress Notes (Signed)
Glucose 106, kidney and liver tests normal, CBC normal, Cholesterol normal, A1c 5.8, TSH 3.49 normal lp

## 2019-06-12 DIAGNOSIS — L578 Other skin changes due to chronic exposure to nonionizing radiation: Secondary | ICD-10-CM | POA: Diagnosis not present

## 2019-06-12 DIAGNOSIS — L570 Actinic keratosis: Secondary | ICD-10-CM | POA: Diagnosis not present

## 2019-06-12 DIAGNOSIS — L821 Other seborrheic keratosis: Secondary | ICD-10-CM | POA: Diagnosis not present

## 2019-11-13 ENCOUNTER — Ambulatory Visit (INDEPENDENT_AMBULATORY_CARE_PROVIDER_SITE_OTHER): Payer: PPO | Admitting: Legal Medicine

## 2019-11-13 ENCOUNTER — Encounter: Payer: Self-pay | Admitting: Legal Medicine

## 2019-11-13 DIAGNOSIS — J019 Acute sinusitis, unspecified: Secondary | ICD-10-CM

## 2019-11-13 DIAGNOSIS — J0100 Acute maxillary sinusitis, unspecified: Secondary | ICD-10-CM | POA: Diagnosis not present

## 2019-11-13 HISTORY — DX: Acute sinusitis, unspecified: J01.90

## 2019-11-13 MED ORDER — AMOXICILLIN-POT CLAVULANATE 875-125 MG PO TABS: 1.0000 | ORAL_TABLET | Freq: Two times a day (BID) | ORAL | 0 refills | Status: DC

## 2019-11-13 MED ORDER — PREDNISONE 10 MG (21) PO TBPK: ORAL_TABLET | ORAL | 0 refills | Status: DC

## 2019-11-13 NOTE — Progress Notes (Signed)
Subjective:  Patient ID: Kelly Horn, female    DOB: 1945-03-07  Age: 74 y.o. MRN: [id number]  Chief Complaint  Patient presents with  . Sinus Problem    HPI: patient is having sinus congestion for 2 weeks, she has seen her dentisit and x-ray demonstrated sinusitis. She is feeling bad but not having fever.   Current Outpatient Medications on File Prior to Visit  Medication Sig Dispense Refill  . levothyroxine (SYNTHROID) 50 MCG tablet Take 1 tablet (50 mcg total) by mouth daily. 90 tablet 2  . simvastatin (ZOCOR) 20 MG tablet Take 1 tablet (20 mg total) by mouth daily. 90 tablet 2   No current facility-administered medications on file prior to visit.   Past Medical History:  Diagnosis Date  . Age-related osteoporosis without current pathological fracture   . GERD with esophagitis   . Mixed hyperlipidemia   . Other specified hypothyroidism   . Type 2 diabetes mellitus with other specified complication [facility])    Past Surgical History:  Procedure Laterality Date  . CERVICAL DISC SURGERY    . PARTIAL HYSTERECTOMY      Family History  Problem Relation Age of Onset  . Cancer Mother   . Heart attack Father   . Diabetes Other   . Arthritis Other    Social History   Socioeconomic History  . Marital status: Married    Spouse name: Not on file  . Number of children: 2  . Years of education: Not on file  . Highest education level: Not on file  Occupational History  . Occupation: retired  Tobacco Use  . Smoking status: Never Smoker  . Smokeless tobacco: Never Used  Vaping Use  . Vaping Use: Never used  Substance and Sexual Activity  . Alcohol use: Not on file  . Drug use: Not on file  . Sexual activity: Not on file  Other Topics Concern  . Not on file  Social History Narrative  . Not on file   Social Determinants of Health   Financial Resource Strain:   . Difficulty of Paying Living Expenses: Not on file  Food Insecurity:   . Worried About Charity fundraiser  in the Last Year: Not on file  . Ran Out of Food in the Last Year: Not on file  Transportation Needs:   . Lack of Transportation (Medical): Not on file  . Lack of Transportation (Non-Medical): Not on file  Physical Activity:   . Days of Exercise per Week: Not on file  . Minutes of Exercise per Session: Not on file  Stress:   . Feeling of Stress : Not on file  Social Connections:   . Frequency of Communication with Friends and Family: Not on file  . Frequency of Social Gatherings with Friends and Family: Not on file  . Attends Religious Services: Not on file  . Active Member of Clubs or Organizations: Not on file  . Attends Archivist Meetings: Not on file  . Marital Status: Not on file    Review of Systems  Constitutional: Negative for activity change and appetite change.  HENT: Positive for congestion, sinus pressure and sinus pain.   Eyes: Negative.   Respiratory: Negative.   Cardiovascular: Negative.   Gastrointestinal: Negative.   Endocrine: Negative.   Genitourinary: Negative.   Neurological: Negative.      Objective:  BP 118/70 (BP Location: Left Arm, Patient Position: Sitting, Cuff Size: Normal)   Pulse 79   Temp 98  F (36.7 C) (Temporal)   Ht 5\' 1"  (1.549 m)   Wt 133 lb 3.2 oz (60.4 kg)   SpO2 96%   BMI 25.17 kg/m   BP/Weight 11/13/2019 3/32/9518  Systolic BP 841 660  Diastolic BP 70 70  Wt. (Lbs) 133.2 132  BMI 25.17 24.94    Physical Exam Vitals reviewed.  Constitutional:      Appearance: Normal appearance.  HENT:     Head: Normocephalic and atraumatic.     Right Ear: Tympanic membrane, ear canal and external ear normal.     Left Ear: Tympanic membrane, ear canal and external ear normal.     Mouth/Throat:     Mouth: Mucous membranes are moist.  Eyes:     Extraocular Movements: Extraocular movements intact.     Conjunctiva/sclera: Conjunctivae normal.     Pupils: Pupils are equal, round, and reactive to light.  Cardiovascular:      Rate and Rhythm: Normal rate.     Pulses: Normal pulses.     Heart sounds: Normal heart sounds.  Pulmonary:     Breath sounds: Normal breath sounds.  Abdominal:     General: Abdomen is flat. Bowel sounds are normal.  Neurological:     Mental Status: She is alert.       Lab Results  Component Value Date   WBC 5.0 06/03/2019   HGB 14.0 06/03/2019   HCT 42.9 06/03/2019   PLT 202 06/03/2019   GLUCOSE 106 (H) 06/03/2019   CHOL 166 06/03/2019   TRIG 111 06/03/2019   HDL 58 06/03/2019   LDLCALC 88 06/03/2019   ALT 17 06/03/2019   AST 21 06/03/2019   NA 142 06/03/2019   K 4.1 06/03/2019   CL 103 06/03/2019   CREATININE 0.85 06/03/2019   BUN 13 06/03/2019   CO2 24 06/03/2019   TSH 3.490 06/03/2019   HGBA1C 5.8 (H) 06/03/2019   MICROALBUR 30 06/03/2019      Assessment & Plan:   1. Acute non-recurrent maxillary sinusitis - amoxicillin-clavulanate (AUGMENTIN) 875-125 MG tablet; Take 1 tablet by mouth 2 (two) times daily.  Dispense: 20 tablet; Refill: 0 - predniSONE (STERAPRED UNI-PAK 21 TAB) 10 MG (21) TBPK tablet; Take 6ills first day , then 5 pills day 2 and then cut down one pill day until gone  Dispense: 21 tablet; Refill: 0 Patient has maxillary sinusitis and needs antibiotics   Meds ordered this encounter  Medications  . amoxicillin-clavulanate (AUGMENTIN) 875-125 MG tablet    Sig: Take 1 tablet by mouth 2 (two) times daily.    Dispense:  20 tablet    Refill:  0  . predniSONE (STERAPRED UNI-PAK 21 TAB) 10 MG (21) TBPK tablet    Sig: Take 6ills first day , then 5 pills day 2 and then cut down one pill day until gone    Dispense:  21 tablet    Refill:  0       Follow-up: Return if symptoms worsen or fail to improve.  An After Visit Summary was printed and given to the patient.  [place] [phone]

## 2019-12-04 ENCOUNTER — Ambulatory Visit (INDEPENDENT_AMBULATORY_CARE_PROVIDER_SITE_OTHER): Payer: PPO | Admitting: Legal Medicine

## 2019-12-04 ENCOUNTER — Encounter: Payer: Self-pay | Admitting: Legal Medicine

## 2019-12-04 ENCOUNTER — Other Ambulatory Visit: Payer: Self-pay

## 2019-12-04 VITALS — BP 116/68 | HR 78 | Temp 97.3°F | Ht 61.0 in | Wt 130.8 lb

## 2019-12-04 DIAGNOSIS — N3001 Acute cystitis with hematuria: Secondary | ICD-10-CM

## 2019-12-04 DIAGNOSIS — E1169 Type 2 diabetes mellitus with other specified complication: Secondary | ICD-10-CM | POA: Diagnosis not present

## 2019-12-04 DIAGNOSIS — E782 Mixed hyperlipidemia: Secondary | ICD-10-CM

## 2019-12-04 DIAGNOSIS — E039 Hypothyroidism, unspecified: Secondary | ICD-10-CM | POA: Diagnosis not present

## 2019-12-04 DIAGNOSIS — Z6824 Body mass index (BMI) 24.0-24.9, adult: Secondary | ICD-10-CM | POA: Diagnosis not present

## 2019-12-04 DIAGNOSIS — K2100 Gastro-esophageal reflux disease with esophagitis, without bleeding: Secondary | ICD-10-CM | POA: Diagnosis not present

## 2019-12-04 LAB — POCT URINALYSIS DIPSTICK
Bilirubin, UA: NEGATIVE
Glucose, UA: NEGATIVE
Ketones, UA: POSITIVE
Nitrite, UA: NEGATIVE
Protein, UA: POSITIVE — AB
Spec Grav, UA: 1.025 (ref 1.010–1.025)
Urobilinogen, UA: 0.2 E.U./dL
pH, UA: 6 (ref 5.0–8.0)

## 2019-12-04 MED ORDER — NITROFURANTOIN MONOHYD MACRO 100 MG PO CAPS: 100.0000 mg | ORAL_CAPSULE | Freq: Two times a day (BID) | ORAL | 0 refills | Status: DC

## 2019-12-04 NOTE — Progress Notes (Signed)
Subjective:  Patient ID: Kelly Horn, female    DOB: Aug 14, 1945  Age: 74 y.o. MRN: [id number]  Chief Complaint  Patient presents with  . Diabetes    25M follow up    HPI: chronic visit Patient present with type 2 diabetes.  Specifically, this is type 2, noninsulin requiring diabetes, complicated by hypertension and hyperlipidemia.  Compliance with treatment has been good; patient take medicines as directed, maintains diet and exercise regimen, follows up as directed, and is keeping glucose diary.  Date of  diagnosis 2010.  Depression screen has been performed.Tobacco screen nonsmoker. Current medicines for diabetes diet and exercise.  Patient is on none for renal protection and simvastatin for cholesterol control.  Patient performs foot exams daily and last ophthalmologic exam was no  Patient presents for follow up of hypertension.  Patient tolerating diet and exercie well with side effects.  Patient was diagnosed with hypertension 2010 so has been treated for hypertension for 10 years.Patient is working on maintaining diet and exercise regimen and follows up as directed. Complication include none  Patient presents with hyperlipidemia.  Compliance with treatment has been good; patient takes medicines as directed, maintains low cholesterol diet, follows up as directed, and maintains exercise regimen.  Patient is using simvastatin without problems...    Current Outpatient Medications on File Prior to Visit  Medication Sig Dispense Refill  . levothyroxine (SYNTHROID) 50 MCG tablet Take 1 tablet (50 mcg total) by mouth daily. 90 tablet 2  . predniSONE (STERAPRED UNI-PAK 21 TAB) 10 MG (21) TBPK tablet Take 6ills first day , then 5 pills day 2 and then cut down one pill day until gone 21 tablet 0  . simvastatin (ZOCOR) 20 MG tablet Take 1 tablet (20 mg total) by mouth daily. 90 tablet 2   No current facility-administered medications on file prior to visit.   Past Medical History:  Diagnosis  Date  . Age-related osteoporosis without current pathological fracture   . GERD with esophagitis   . Mixed hyperlipidemia   . Other specified hypothyroidism   . Type 2 diabetes mellitus with other specified complication [facility])    Past Surgical History:  Procedure Laterality Date  . CERVICAL DISC SURGERY    . PARTIAL HYSTERECTOMY      Family History  Problem Relation Age of Onset  . Cancer Mother   . Heart attack Father   . Diabetes Other   . Arthritis Other    Social History   Socioeconomic History  . Marital status: Married    Spouse name: Not on file  . Number of children: 2  . Years of education: Not on file  . Highest education level: Not on file  Occupational History  . Occupation: retired  Tobacco Use  . Smoking status: Never Smoker  . Smokeless tobacco: Never Used  Vaping Use  . Vaping Use: Never used  Substance and Sexual Activity  . Alcohol use: Not on file  . Drug use: Not on file  . Sexual activity: Not on file  Other Topics Concern  . Not on file  Social History Narrative  . Not on file   Social Determinants of Health   Financial Resource Strain:   . Difficulty of Paying Living Expenses: Not on file  Food Insecurity:   . Worried About Charity fundraiser in the Last Year: Not on file  . Ran Out of Food in the Last Year: Not on file  Transportation Needs:   . Lack of  Transportation (Medical): Not on file  . Lack of Transportation (Non-Medical): Not on file  Physical Activity:   . Days of Exercise per Week: Not on file  . Minutes of Exercise per Session: Not on file  Stress:   . Feeling of Stress : Not on file  Social Connections:   . Frequency of Communication with Friends and Family: Not on file  . Frequency of Social Gatherings with Friends and Family: Not on file  . Attends Religious Services: Not on file  . Active Member of Clubs or Organizations: Not on file  . Attends Archivist Meetings: Not on file  . Marital Status: Not on  file    Review of Systems  Constitutional: Negative for activity change, appetite change and fatigue.  HENT: Negative for congestion, sinus pressure and sinus pain.   Respiratory: Positive for apnea and chest tightness. Negative for shortness of breath.   Cardiovascular: Negative for chest pain, palpitations and leg swelling.  Gastrointestinal: Negative.   Endocrine: Positive for polydipsia. Negative for polyuria.  Genitourinary: Positive for dysuria. Negative for difficulty urinating.  Musculoskeletal: Positive for arthralgias.  Skin: Negative.   Neurological: Negative.   Psychiatric/Behavioral: Negative.      Objective:  BP 116/68 (BP Location: Right Arm, Patient Position: Sitting, Cuff Size: Normal)   Pulse 78   Temp (!) 97.3 F (36.3 C) (Temporal)   Ht 5\' 1"  (1.549 m)   Wt 130 lb 12.8 oz (59.3 kg)   BMI 24.71 kg/m   BP/Weight 12/04/2019 11/13/2019 8/00/3491  Systolic BP 791 505 697  Diastolic BP 68 70 70  Wt. (Lbs) 130.8 133.2 132  BMI 24.71 25.17 24.94    Physical Exam Vitals reviewed.  Constitutional:      Appearance: Normal appearance.  HENT:     Head: Normocephalic and atraumatic.     Right Ear: Tympanic membrane normal.     Left Ear: Tympanic membrane normal.     Nose: Nose normal.     Mouth/Throat:     Mouth: Mucous membranes are moist.     Pharynx: Oropharynx is clear.  Eyes:     Conjunctiva/sclera: Conjunctivae normal.     Pupils: Pupils are equal, round, and reactive to light.  Cardiovascular:     Rate and Rhythm: Normal rate and regular rhythm.     Pulses: Normal pulses.     Heart sounds: Normal heart sounds.  Pulmonary:     Effort: Pulmonary effort is normal.     Breath sounds: Normal breath sounds.  Abdominal:     General: Abdomen is flat. Bowel sounds are normal.     Palpations: Abdomen is soft.  Musculoskeletal:        General: Normal range of motion.     Cervical back: Normal range of motion and neck supple.  Skin:    General: Skin is  warm.     Capillary Refill: Capillary refill takes less than 2 seconds.  Neurological:     General: No focal deficit present.     Mental Status: She is alert.     Diabetic Foot Exam - Simple   Simple Foot Form Diabetic Foot exam was performed with the following findings: Yes 12/04/2019  7:52 AM  Visual Inspection No deformities, no ulcerations, no other skin breakdown bilaterally: Yes Sensation Testing Intact to touch and monofilament testing bilaterally: Yes Pulse Check Posterior Tibialis and Dorsalis pulse intact bilaterally: Yes Comments      Lab Results  Component Value Date   WBC  5.0 06/03/2019   HGB 14.0 06/03/2019   HCT 42.9 06/03/2019   PLT 202 06/03/2019   GLUCOSE 106 (H) 06/03/2019   CHOL 166 06/03/2019   TRIG 111 06/03/2019   HDL 58 06/03/2019   LDLCALC 88 06/03/2019   ALT 17 06/03/2019   AST 21 06/03/2019   NA 142 06/03/2019   K 4.1 06/03/2019   CL 103 06/03/2019   CREATININE 0.85 06/03/2019   BUN 13 06/03/2019   CO2 24 06/03/2019   TSH 3.490 06/03/2019   HGBA1C 5.8 (H) 06/03/2019   MICROALBUR 30 06/03/2019      Assessment & Plan:     Diagnoses and all orders for this visit: Gastroesophageal reflux disease with esophagitis, unspecified whether hemorrhage Plan of care was formulated today.  She is doing well.  A plan of care was formulated using patient exam, tests and other sources to optimize care using evidence based information.  Recommend no smoking, no eating after supper, avoid fatty foods, elevate Head of bed, avoid tight fitting clothing.  Continue on OTC . Primary hypothyroidism -     TSH Patient is known to have hypothyroidism and is n treatment with levothyroxine 50 mcg.  Patient was diagnosed 10 years ago.  Other treatment includes none.  Patient is compliant with medicines and last TSH 6 months ago.  Last TSH was normal . Type 2 diabetes mellitus with other specified complication, unspecified whether long term insulin use (HCC) -      CBC with Differential/Platelet -     Comprehensive metabolic panel -     Hemoglobin A1c An individual care plan for diabetes was established and reinforced today.  The patient's status was assessed using clinical findings on exam, labs and diagnostic testing. Patient success at meeting goals based on disease specific evidence-based guidelines and found to be good controlled. Medications were assessed and patient's understanding of the medical issues , including barriers were assessed. Recommend adherence to a diabetic diet, a graduated exercise program, HgbA1c level is checked quarterly, and urine microalbumin performed yearly .  Annual mono-filament sensation testing performed. Lower blood pressure and control hyperlipidemia is important. Get annual eye exams and annual flu shots and smoking cessation discussed.  Self management goals were discussed.  BMI 24.0-24.9, adult Patient has good weight and will continue diet and exercise  Mixed hyperlipidemia -     Lipid panel AN INDIVIDUAL CARE PLAN for hyperlipidemia/ cholesterol was established and reinforced today.  The patient's status was assessed using clinical findings on exam, lab and other diagnostic tests. The patient's disease status was assessed based on evidence-based guidelines and found to be well controlled. MEDICATIONS were reviewed. SELF MANAGEMENT GOALS have been discussed and patient's success at attaining the goal of low cholesterol was assessed. RECOMMENDATION given include regular exercise 3 days a week and low cholesterol/low fat diet. CLINICAL SUMMARY including written plan to identify barriers unique to the patient due to social or economic  reasons was discussed.  Acute cystitis with hematuria  -     Urine Culture -     POCT urinalysis dipstick -     nitrofurantoin, macrocrystal-monohydrate, (MACROBID) 100 MG capsule; Take 1 capsule (100 mg total) by mouth 2 (two) times daily. Patient has uti symptoms and we will treat  with macobid    Orders Placed This Encounter  Procedures  . Urine Culture  . POCT urinalysis dipstick     I spent 30 minutes dedicated to the care of this patient on the date of  this encounter to include face-to-face time with the patient, as well as: Preparing to see the patient (review of tests). Obtaining and/or reviewing separately obtained history. Performing a medically appropriate examination and/or evaluation. Counseling and educating the patient/family/caregiver. Ordering medications, tests, or procedures. Documenting clinical information in the electronic or other health record. Independently interpreting results (not separately reported) and communicating results to the patient/family/caregiver.  My nursing staff have aided in the documentation of this note on the behalf of Reinaldo Meeker, MD,as directed by  Reinaldo Meeker, MD and thoroughly reviewed by Reinaldo Meeker, MD.  Follow-up: Return in about 6 months (around 06/02/2020) for fasting.  An After Visit Summary was printed and given to the patient.  Reinaldo Meeker, MD [facility] [phone]

## 2019-12-05 LAB — CBC WITH DIFFERENTIAL/PLATELET
Basophils Absolute: 0 10*3/uL (ref 0.0–0.2)
Basos: 0 %
EOS (ABSOLUTE): 0.2 10*3/uL (ref 0.0–0.4)
Eos: 2 %
Hematocrit: 40 % (ref 34.0–46.6)
Hemoglobin: 13.3 g/dL (ref 11.1–15.9)
Immature Grans (Abs): 0 10*3/uL (ref 0.0–0.1)
Immature Granulocytes: 0 %
Lymphocytes Absolute: 1.7 10*3/uL (ref 0.7–3.1)
Lymphs: 24 %
MCH: 30.9 pg (ref 26.6–33.0)
MCHC: 33.3 g/dL (ref 31.5–35.7)
MCV: 93 fL (ref 79–97)
Monocytes Absolute: 0.5 10*3/uL (ref 0.1–0.9)
Monocytes: 7 %
Neutrophils Absolute: 4.7 10*3/uL (ref 1.4–7.0)
Neutrophils: 67 %
Platelets: 205 10*3/uL (ref 150–450)
RBC: 4.3 x10E6/uL (ref 3.77–5.28)
RDW: 12.4 % (ref 11.7–15.4)
WBC: 7.1 10*3/uL (ref 3.4–10.8)

## 2019-12-05 LAB — HEMOGLOBIN A1C
Est. average glucose Bld gHb Est-mCnc: 131 mg/dL
Hgb A1c MFr Bld: 6.2 % — ABNORMAL HIGH (ref 4.8–5.6)

## 2019-12-05 LAB — COMPREHENSIVE METABOLIC PANEL
ALT: 14 IU/L (ref 0–32)
AST: 18 IU/L (ref 0–40)
Albumin/Globulin Ratio: 1.8 (ref 1.2–2.2)
Albumin: 4.2 g/dL (ref 3.7–4.7)
Alkaline Phosphatase: 63 IU/L (ref 44–121)
BUN/Creatinine Ratio: 10 — ABNORMAL LOW (ref 12–28)
BUN: 8 mg/dL (ref 8–27)
Bilirubin Total: 0.6 mg/dL (ref 0.0–1.2)
CO2: 23 mmol/L (ref 20–29)
Calcium: 8.7 mg/dL (ref 8.7–10.3)
Chloride: 105 mmol/L (ref 96–106)
Creatinine, Ser: 0.83 mg/dL (ref 0.57–1.00)
GFR calc Af Amer: 80 mL/min/{1.73_m2} (ref >59)
GFR calc non Af Amer: 69 mL/min/{1.73_m2} (ref >59)
Globulin, Total: 2.3 g/dL (ref 1.5–4.5)
Glucose: 100 mg/dL — ABNORMAL HIGH (ref 65–99)
Potassium: 4.3 mmol/L (ref 3.5–5.2)
Sodium: 143 mmol/L (ref 134–144)
Total Protein: 6.5 g/dL (ref 6.0–8.5)

## 2019-12-05 LAB — TSH: TSH: 4.97 u[IU]/mL — ABNORMAL HIGH (ref 0.450–4.500)

## 2019-12-05 LAB — LIPID PANEL
Chol/HDL Ratio: 2.5 ratio (ref 0.0–4.4)
Cholesterol, Total: 136 mg/dL (ref 100–199)
HDL: 54 mg/dL (ref >39)
LDL Chol Calc (NIH): 67 mg/dL (ref 0–99)
Triglycerides: 75 mg/dL (ref 0–149)
VLDL Cholesterol Cal: 15 mg/dL (ref 5–40)

## 2019-12-05 LAB — CARDIOVASCULAR RISK ASSESSMENT

## 2019-12-05 NOTE — Progress Notes (Signed)
CBC normal, glucose 100, kidneys normal, liver tests, A1c 6.2, Cholesterol normal, TSH slightly high 4.9- we will watch lp

## 2019-12-06 LAB — URINE CULTURE

## 2019-12-07 NOTE — Progress Notes (Signed)
Urine culture mixed urogenital flora lp

## 2019-12-16 DIAGNOSIS — L570 Actinic keratosis: Secondary | ICD-10-CM | POA: Diagnosis not present

## 2019-12-17 ENCOUNTER — Other Ambulatory Visit: Payer: Self-pay

## 2019-12-17 ENCOUNTER — Encounter: Payer: PPO | Admitting: Family Medicine

## 2019-12-17 ENCOUNTER — Encounter: Payer: Self-pay | Admitting: Family Medicine

## 2019-12-17 VITALS — BP 162/78 | HR 95 | Resp 18 | Ht 61.5 in | Wt 133.0 lb

## 2019-12-17 DIAGNOSIS — Z0000 Encounter for general adult medical examination without abnormal findings: Secondary | ICD-10-CM

## 2019-12-17 HISTORY — DX: Encounter for general adult medical examination without abnormal findings: Z00.00

## 2019-12-17 NOTE — Progress Notes (Signed)
Kelly Horn is a 74 y.o. female who presents for Medicare Annual/Subsequent preventive examination.  Preventive Screening-Counseling & Management  Tobacco Social History   Tobacco Use  Smoking Status Never Smoker  Smokeless Tobacco Never Used     Problems Prior to Visit 1. Needs eye exam  Current Problems (verified) Patient Active Problem List   Diagnosis Date Noted  . Acute sinusitis 11/13/2019  . Menopause present 06/03/2019  . BMI 24.0-24.9, adult 06/03/2019  . Mixed hyperlipidemia   . Primary hypothyroidism   . Type 2 diabetes mellitus with other specified complication ([place])   . GERD with esophagitis   . Age-related osteoporosis without current pathological fracture     Medications Prior to Visit Current Outpatient Medications on File Prior to Visit  Medication Sig Dispense Refill  . levothyroxine (SYNTHROID) 50 MCG tablet Take 1 tablet (50 mcg total) by mouth daily. 90 tablet 2  . simvastatin (ZOCOR) 20 MG tablet Take 1 tablet (20 mg total) by mouth daily. 90 tablet 2   No current facility-administered medications on file prior to visit.    Current Medications (verified) Current Outpatient Medications  Medication Sig Dispense Refill  . levothyroxine (SYNTHROID) 50 MCG tablet Take 1 tablet (50 mcg total) by mouth daily. 90 tablet 2  . simvastatin (ZOCOR) 20 MG tablet Take 1 tablet (20 mg total) by mouth daily. 90 tablet 2   No current facility-administered medications for this visit.     Allergies (verified) Cimetidine   PAST HISTORY  Family History Family History  Problem Relation Age of Onset  . Cancer Mother   . Heart attack Father   . Diabetes Other   . Arthritis Other     Social History Social History   Tobacco Use  . Smoking status: Never Smoker  . Smokeless tobacco: Never Used  Substance Use Topics  . Alcohol use: Not on file     Are there smokers in your home (other than you)? none  Risk Factors Current exercise habits: raking  leaves/mowing Dietary issues discussed: cook at home x 3 meals  Cardiac risk factors: DM-type 2/hyperlipidemia  Depression ScreenActivities of Daily Living In your present state of health, do you have any difficulty performing the following activities?:  Driving? No concerns Managing money?  No concerns Feeding yourself? No concerns Getting from bed to chair No concerns Climbing a flight of stairs? No concerns-use handrail Preparing food and eating?: No concerns Bathing or showering?Getting dressed: Getting to the toilet? No concerns Using the toilet:no concerns Moving around from place to place: In the past year have you fallen or had a near fall?:yeno concerns sterday-raking leaves-took 2 tylenol-back    Do you have more than one partner?  57years  Hearing Difficulties: no hearing concerns  Do you feel that you have a problem with memory? yes  Do you feel safe at home?  yes Cognitive Testing   Advanced Directives have been discussed with the patient?  Pt has a living will -no copy on file List the Names of Other Physician/Practitioners you currently use: 1.  Dr. Matt Holmes specialist 2.  GYN-no longer going for pap smear Mammogram and DEXA in [employer] History  Administered Date(s) Administered  . Fluad Quad(high Dose 65+) 10/17/2019  . Influenza-Unspecified 09/30/2013, 10/17/2015, 09/16/2017  . Moderna SARS-COVID-2 Vaccination 03/14/2019, 04/16/2019    Screening Tests Health Maintenance  Topic Date Due  . Hepatitis C Screening  Never done  . TETANUS/TDAP  Never done  . PNA vac Low Risk Adult (  1 of 2 - PCV13) Never done  . OPHTHALMOLOGY EXAM  01/21/2020  . HEMOGLOBIN A1C  06/02/2020  . URINE MICROALBUMIN  06/02/2020  . FOOT EXAM  12/03/2020  . COLONOSCOPY  05/05/2027  . INFLUENZA VACCINE  Completed  . DEXA SCAN  Completed  . COVID-19 Vaccine  Completed    All answers were reviewed with the patient and necessary referrals were made:  Mertha Baars, MD   12/17/2019    Objective:    Vision by Snellen chart: right PRX:YVOPF 20/80, right 20/80, left 20/80-pt wearing contacts-read with no difficulty Body mass index is 24.72 kg/m. BP (!) 162/78   Pulse 95   Resp 18   Ht 5' 1.5" (1.562 m)   Wt 133 lb (60.3 kg)   SpO2 97%   BMI 24.72 kg/m       Assessment:     1. Medicare annual wellness visit, subsequent Will check DEXA/mammogram Plan:    During the course of the visit the patient was educated and counseled about appropriate screening and preventive services including:   Medicare Attestation I have personally reviewed: The patient's medical and social history Their use of alcohol, tobacco or illicit drugs Their current medications and supplements The patient's functional ability including ADLs,fall risks, home safety risks, cognitive, and hearing and visual impairment Diet and physical activities Evidence for depression or mood disorders  The patient's weight, height, BMI, and visual acuity have been recorded in the chart.  I have made referrals, counseling, and provided education to the patient based on review of the above and I have provided the patient with a written personalized care plan for preventive services.    Recheck  BP 152/90 -left arm  Mertha Baars, MD   12/17/2019       This encounter was created in error - please disregard.

## 2019-12-17 NOTE — Patient Instructions (Addendum)
  Kelly Horn , Thank you for taking time to come for your Medicare Wellness Visit. I appreciate your ongoing commitment to your health goals. Please review the following plan we discussed and let me know if I can assist you in the future.   These are the goals we discussed: Schedule eye exam-20/80 on exam- Schedule COVID booster Check blood pressure at home less than 140/90  Goals    . DIET - REDUCE FAT INTAKE       This is a list of the screening recommended for you and due dates:  Health Maintenance  Topic Date Due  .  Hepatitis C: One time screening is recommended by Center for Disease Control  (CDC) for  adults born from [birth year] through 1966.   Never done  . Tetanus Vaccine  Never done  . Pneumonia vaccines (1 of 2 - PCV13) Never done  . Eye exam for diabetics  01/21/2020  . Hemoglobin A1C  06/02/2020  . Urine Protein Check  06/02/2020  . Complete foot exam   12/03/2020  . Colon Cancer Screening  05/05/2027  . Flu Shot  Completed  . DEXA scan (bone density measurement)  Completed  . COVID-19 Vaccine  Completed

## 2020-01-05 ENCOUNTER — Encounter: Payer: Self-pay | Admitting: Legal Medicine

## 2020-02-04 DIAGNOSIS — R928 Other abnormal and inconclusive findings on diagnostic imaging of breast: Secondary | ICD-10-CM | POA: Diagnosis not present

## 2020-02-04 DIAGNOSIS — Z1231 Encounter for screening mammogram for malignant neoplasm of breast: Secondary | ICD-10-CM | POA: Diagnosis not present

## 2020-02-04 LAB — HM MAMMOGRAPHY

## 2020-02-06 ENCOUNTER — Other Ambulatory Visit: Payer: Self-pay

## 2020-02-06 DIAGNOSIS — R928 Other abnormal and inconclusive findings on diagnostic imaging of breast: Secondary | ICD-10-CM

## 2020-02-10 ENCOUNTER — Other Ambulatory Visit: Payer: Self-pay

## 2020-02-10 ENCOUNTER — Encounter: Payer: Self-pay | Admitting: Legal Medicine

## 2020-02-10 DIAGNOSIS — R928 Other abnormal and inconclusive findings on diagnostic imaging of breast: Secondary | ICD-10-CM

## 2020-02-26 DIAGNOSIS — N6489 Other specified disorders of breast: Secondary | ICD-10-CM | POA: Diagnosis not present

## 2020-02-26 DIAGNOSIS — R928 Other abnormal and inconclusive findings on diagnostic imaging of breast: Secondary | ICD-10-CM | POA: Diagnosis not present

## 2020-05-07 DIAGNOSIS — J3489 Other specified disorders of nose and nasal sinuses: Secondary | ICD-10-CM | POA: Diagnosis not present

## 2020-05-07 DIAGNOSIS — Z9889 Other specified postprocedural states: Secondary | ICD-10-CM | POA: Diagnosis not present

## 2020-05-07 DIAGNOSIS — J342 Deviated nasal septum: Secondary | ICD-10-CM | POA: Diagnosis not present

## 2020-05-07 DIAGNOSIS — R0981 Nasal congestion: Secondary | ICD-10-CM | POA: Diagnosis not present

## 2020-05-07 DIAGNOSIS — J329 Chronic sinusitis, unspecified: Secondary | ICD-10-CM | POA: Diagnosis not present

## 2020-05-10 ENCOUNTER — Other Ambulatory Visit: Payer: Self-pay | Admitting: Legal Medicine

## 2020-05-10 DIAGNOSIS — E782 Mixed hyperlipidemia: Secondary | ICD-10-CM

## 2020-05-19 DIAGNOSIS — J329 Chronic sinusitis, unspecified: Secondary | ICD-10-CM | POA: Diagnosis not present

## 2020-05-19 DIAGNOSIS — M26603 Bilateral temporomandibular joint disorder, unspecified: Secondary | ICD-10-CM | POA: Diagnosis not present

## 2020-05-19 DIAGNOSIS — J323 Chronic sphenoidal sinusitis: Secondary | ICD-10-CM | POA: Diagnosis not present

## 2020-05-19 DIAGNOSIS — J321 Chronic frontal sinusitis: Secondary | ICD-10-CM | POA: Diagnosis not present

## 2020-05-19 DIAGNOSIS — M2669 Other specified disorders of temporomandibular joint: Secondary | ICD-10-CM | POA: Diagnosis not present

## 2020-05-24 DIAGNOSIS — J342 Deviated nasal septum: Secondary | ICD-10-CM | POA: Diagnosis not present

## 2020-05-24 DIAGNOSIS — J324 Chronic pansinusitis: Secondary | ICD-10-CM | POA: Diagnosis not present

## 2020-05-24 DIAGNOSIS — R0981 Nasal congestion: Secondary | ICD-10-CM | POA: Diagnosis not present

## 2020-06-02 ENCOUNTER — Encounter: Payer: Self-pay | Admitting: Legal Medicine

## 2020-06-02 ENCOUNTER — Ambulatory Visit (INDEPENDENT_AMBULATORY_CARE_PROVIDER_SITE_OTHER): Payer: PPO | Admitting: Legal Medicine

## 2020-06-02 ENCOUNTER — Other Ambulatory Visit: Payer: Self-pay

## 2020-06-02 VITALS — BP 124/68 | HR 77 | Temp 96.3°F | Ht 61.0 in | Wt 129.0 lb

## 2020-06-02 DIAGNOSIS — E782 Mixed hyperlipidemia: Secondary | ICD-10-CM | POA: Diagnosis not present

## 2020-06-02 DIAGNOSIS — E1169 Type 2 diabetes mellitus with other specified complication: Secondary | ICD-10-CM | POA: Diagnosis not present

## 2020-06-02 DIAGNOSIS — K2100 Gastro-esophageal reflux disease with esophagitis, without bleeding: Secondary | ICD-10-CM | POA: Diagnosis not present

## 2020-06-02 DIAGNOSIS — E039 Hypothyroidism, unspecified: Secondary | ICD-10-CM | POA: Diagnosis not present

## 2020-06-02 DIAGNOSIS — M810 Age-related osteoporosis without current pathological fracture: Secondary | ICD-10-CM

## 2020-06-02 NOTE — Progress Notes (Signed)
Subjective:  Patient ID: Kelly Horn, female    DOB: 16-Sep-1945  Age: 75 y.o. MRN: [id number]  Chief Complaint  Patient presents with  . Hyperlipidemia  . Hypothyroidism    HPI: chronic visit Patient present with type 2 diabetes.  Specifically, this is type 2, noninsulin requiring diabetes, complicated by hyperlipidemia, thyroid.  Compliance with treatment has been good; patient take medicines as directed, maintains diet and exercise regimen, follows up as directed, and is keeping glucose diary.  Date of  diagnosis 2010.  Depression screen has been performed.Tobacco screen nonsmoker. Current medicines for diabetes none.  Patient is on none for renal protection and simvastatin for cholesterol control.  Patient performs foot exams daily and last ophthalmologic exam was none.  Patient presents with hyperlipidemia.  Compliance with treatment has been good; patient takes medicines as directed, maintains low cholesterol diet, follows up as directed, and maintains exercise regimen.  Patient is using simvastatin without problems.     Current Outpatient Medications on File Prior to Visit  Medication Sig Dispense Refill  . Calcium Carbonate-Vit D-Min (CALCIUM 1200 PO) Take 1,200 mg by mouth daily.    . Investigational vitamin D 600 UNITS capsule SWOG [id number] Take 600 Units by mouth daily. Take with food.    Marland Kitchen levothyroxine (SYNTHROID) 50 MCG tablet Take 1 tablet (50 mcg total) by mouth daily. 90 tablet 2  . simvastatin (ZOCOR) 20 MG tablet TAKE 1 TABLET BY MOUTH DAILY 90 tablet 1   No current facility-administered medications on file prior to visit.   Past Medical History:  Diagnosis Date  . Age-related osteoporosis without current pathological fracture   . GERD with esophagitis   . Mixed hyperlipidemia   . Other specified hypothyroidism   . Type 2 diabetes mellitus with other specified complication [facility])    Past Surgical History:  Procedure Laterality Date  . CERVICAL DISC SURGERY     . PARTIAL HYSTERECTOMY      Family History  Problem Relation Age of Onset  . Cancer Mother   . Heart attack Father   . Diabetes Other   . Arthritis Other    Social History   Socioeconomic History  . Marital status: Married    Spouse name: Not on file  . Number of children: 2  . Years of education: Not on file  . Highest education level: Not on file  Occupational History  . Occupation: retired  Tobacco Use  . Smoking status: Never Smoker  . Smokeless tobacco: Never Used  Vaping Use  . Vaping Use: Never used  Substance and Sexual Activity  . Alcohol use: Not on file  . Drug use: Not on file  . Sexual activity: Not on file  Other Topics Concern  . Not on file  Social History Narrative  . Not on file   Social Determinants of Health   Financial Resource Strain: Not on file  Food Insecurity: Not on file  Transportation Needs: Not on file  Physical Activity: Not on file  Stress: Not on file  Social Connections: Not on file    Review of Systems  Constitutional: Negative for chills, fatigue and fever.  HENT: Positive for congestion, sinus pressure and sinus pain. Negative for ear pain, postnasal drip, rhinorrhea and sore throat.   Eyes: Negative for visual disturbance.  Respiratory: Negative for cough, chest tightness and shortness of breath.   Cardiovascular: Negative for chest pain.  Gastrointestinal: Negative for diarrhea and nausea.  Endocrine: Negative for polyuria.  Genitourinary:  Negative for difficulty urinating and dysuria.  Musculoskeletal: Negative for arthralgias and back pain.  Neurological: Negative for dizziness and headaches.  Psychiatric/Behavioral: Negative.      Objective:  BP 124/68   Pulse 77   Temp (!) 96.3 F (35.7 C)   Ht 5\' 1"  (1.549 m)   Wt 129 lb (58.5 kg)   SpO2 99%   BMI 24.37 kg/m   BP/Weight 06/02/2020 12/17/2019 26/94/8546  Systolic BP 270 350 093  Diastolic BP 68 78 68  Wt. (Lbs) 129 133 130.8  BMI 24.37 24.72 24.71     Physical Exam Vitals reviewed.  Constitutional:      Appearance: Normal appearance.  HENT:     Head: Normocephalic and atraumatic.     Right Ear: Tympanic membrane normal.     Left Ear: Tympanic membrane normal.     Nose: Nose normal.     Mouth/Throat:     Mouth: Mucous membranes are moist.  Eyes:     Extraocular Movements: Extraocular movements intact.     Conjunctiva/sclera: Conjunctivae normal.     Pupils: Pupils are equal, round, and reactive to light.  Cardiovascular:     Rate and Rhythm: Normal rate and regular rhythm.     Pulses: Normal pulses.     Heart sounds: Normal heart sounds. No murmur heard. No gallop.   Pulmonary:     Effort: Pulmonary effort is normal. No respiratory distress.     Breath sounds: Normal breath sounds. No rales.  Abdominal:     General: Abdomen is flat. Bowel sounds are normal. There is no distension.     Palpations: Abdomen is soft.     Tenderness: There is no abdominal tenderness.  Musculoskeletal:        General: Normal range of motion.     Cervical back: Normal range of motion and neck supple.     Comments: hebermans nodes  Skin:    General: Skin is warm.     Capillary Refill: Capillary refill takes less than 2 seconds.  Neurological:     General: No focal deficit present.     Mental Status: She is alert. Mental status is at baseline.  Psychiatric:        Mood and Affect: Mood normal.        Thought Content: Thought content normal.        Judgment: Judgment normal.     Diabetic Foot Exam - Simple   Simple Foot Form Diabetic Foot exam was performed with the following findings: Yes 06/02/2020  7:50 AM  Visual Inspection No deformities, no ulcerations, no other skin breakdown bilaterally: Yes Sensation Testing Intact to touch and monofilament testing bilaterally: Yes Pulse Check Posterior Tibialis and Dorsalis pulse intact bilaterally: Yes Comments      Lab Results  Component Value Date   WBC 7.1 12/04/2019   HGB 13.3  12/04/2019   HCT 40.0 12/04/2019   PLT 205 12/04/2019   GLUCOSE 100 (H) 12/04/2019   CHOL 136 12/04/2019   TRIG 75 12/04/2019   HDL 54 12/04/2019   LDLCALC 67 12/04/2019   ALT 14 12/04/2019   AST 18 12/04/2019   NA 143 12/04/2019   K 4.3 12/04/2019   CL 105 12/04/2019   CREATININE 0.83 12/04/2019   BUN 8 12/04/2019   CO2 23 12/04/2019   TSH 4.970 (H) 12/04/2019   HGBA1C 6.2 (H) 12/04/2019   MICROALBUR 30 06/03/2019      Assessment & Plan:   1. Type 2 diabetes  mellitus with other specified complication, unspecified whether long term insulin use (HCC) - CBC with Differential/Platelet - Comprehensive metabolic panel - Hemoglobin A1c An individual care plan for diabetes was established and reinforced today.  The patient's status was assessed using clinical findings on exam, labs and diagnostic testing. Patient success at meeting goals based on disease specific evidence-based guidelines and found to be fair controlled. Medications were assessed and patient's understanding of the medical issues , including barriers were assessed. Recommend adherence to a diabetic diet, a graduated exercise program, HgbA1c level is checked quarterly, and urine microalbumin performed yearly .  Annual mono-filament sensation testing performed. Lower blood pressure and control hyperlipidemia is important. Get annual eye exams and annual flu shots and smoking cessation discussed.  Self management goals were discussed.  2. Gastroesophageal reflux disease with esophagitis, unspecified whether hemorrhage Plan of care was formulated today.  She is doing well.  A plan of care was formulated using patient exam, tests and other sources to optimize care using evidence based information.  Recommend no smoking, no eating after supper, avoid fatty foods, elevate Head of bed, avoid tight fitting clothing.  Continue on otc.  3. Primary hypothyroidism - TSH Patient is known to have hypothyroid and is n treatment with  levothyroxine 58mcg.  Patient was diagnosed 10 years ago.  Other treatment includes none.  Patient is compliant with medicines and last TSH 6 months ago.  Last TSH was normal.  4. Age-related osteoporosis without current pathological fracture AN INDIVIDUAL CARE PLAN for osteoporosis was established and reinforced today.  The patient's status was assessed using clinical findings on exam, labs, and other diagnostic testing. Patient's success at meeting treatment goals based on disease specific evidence-bassed guidelines and found to be in  fair control. RECOMMENDATIONS include maintaining present medicines and treatment.  5. Mixed hyperlipidemia - Lipid panel AN INDIVIDUAL CARE PLAN for hyperlipidemia/ cholesterol was established and reinforced today.  The patient's status was assessed using clinical findings on exam, lab and other diagnostic tests. The patient's disease status was assessed based on evidence-based guidelines and found to be fair controlled. MEDICATIONS were reviewed. SELF MANAGEMENT GOALS have been discussed and patient's success at attaining the goal of low cholesterol was assessed. RECOMMENDATION given include regular exercise 3 days a week and low cholesterol/low fat diet. CLINICAL SUMMARY including written plan to identify barriers unique to the patient due to social or economic  reasons was discussed.     Orders Placed This Encounter  Procedures  . CBC with Differential/Platelet  . Comprehensive metabolic panel  . Hemoglobin A1c  . Lipid panel  . TSH   visit 30 minutes with review of records  Follow-up: Return in about 4 months (around 10/03/2020) for fasting.  An After Visit Summary was printed and given to the patient.  Reinaldo Meeker, MD [facility] [phone]

## 2020-06-03 LAB — COMPREHENSIVE METABOLIC PANEL
ALT: 16 IU/L (ref 0–32)
AST: 21 IU/L (ref 0–40)
Albumin/Globulin Ratio: 2.4 — ABNORMAL HIGH (ref 1.2–2.2)
Albumin: 4.7 g/dL (ref 3.7–4.7)
Alkaline Phosphatase: 62 IU/L (ref 44–121)
BUN/Creatinine Ratio: 22 (ref 12–28)
BUN: 21 mg/dL (ref 8–27)
Bilirubin Total: 0.7 mg/dL (ref 0.0–1.2)
CO2: 23 mmol/L (ref 20–29)
Calcium: 9.6 mg/dL (ref 8.7–10.3)
Chloride: 105 mmol/L (ref 96–106)
Creatinine, Ser: 0.95 mg/dL (ref 0.57–1.00)
Globulin, Total: 2 g/dL (ref 1.5–4.5)
Glucose: 107 mg/dL — ABNORMAL HIGH (ref 65–99)
Potassium: 4.4 mmol/L (ref 3.5–5.2)
Sodium: 143 mmol/L (ref 134–144)
Total Protein: 6.7 g/dL (ref 6.0–8.5)
eGFR: 62 mL/min/{1.73_m2} (ref >59)

## 2020-06-03 LAB — CBC WITH DIFFERENTIAL/PLATELET
Basophils Absolute: 0 10*3/uL (ref 0.0–0.2)
Basos: 0 %
EOS (ABSOLUTE): 0.1 10*3/uL (ref 0.0–0.4)
Eos: 3 %
Hematocrit: 42.6 % (ref 34.0–46.6)
Hemoglobin: 13.9 g/dL (ref 11.1–15.9)
Immature Grans (Abs): 0 10*3/uL (ref 0.0–0.1)
Immature Granulocytes: 0 %
Lymphocytes Absolute: 2.1 10*3/uL (ref 0.7–3.1)
Lymphs: 45 %
MCH: 29.8 pg (ref 26.6–33.0)
MCHC: 32.6 g/dL (ref 31.5–35.7)
MCV: 91 fL (ref 79–97)
Monocytes Absolute: 0.4 10*3/uL (ref 0.1–0.9)
Monocytes: 9 %
Neutrophils Absolute: 2 10*3/uL (ref 1.4–7.0)
Neutrophils: 43 %
Platelets: 212 10*3/uL (ref 150–450)
RBC: 4.66 x10E6/uL (ref 3.77–5.28)
RDW: 12.6 % (ref 11.7–15.4)
WBC: 4.7 10*3/uL (ref 3.4–10.8)

## 2020-06-03 LAB — CARDIOVASCULAR RISK ASSESSMENT

## 2020-06-03 LAB — HEMOGLOBIN A1C
Est. average glucose Bld gHb Est-mCnc: 128 mg/dL
Hgb A1c MFr Bld: 6.1 % — ABNORMAL HIGH (ref 4.8–5.6)

## 2020-06-03 LAB — LIPID PANEL
Chol/HDL Ratio: 2.8 ratio (ref 0.0–4.4)
Cholesterol, Total: 164 mg/dL (ref 100–199)
HDL: 59 mg/dL (ref >39)
LDL Chol Calc (NIH): 88 mg/dL (ref 0–99)
Triglycerides: 90 mg/dL (ref 0–149)
VLDL Cholesterol Cal: 17 mg/dL (ref 5–40)

## 2020-06-03 LAB — TSH: TSH: 2.93 u[IU]/mL (ref 0.450–4.500)

## 2020-06-03 NOTE — Progress Notes (Signed)
CBC normal, glucose 107, kidney tests normal, liver tests normal, A1c 6.1 good, cholesterol normal, TSH 2.93 normal lp

## 2020-07-02 ENCOUNTER — Other Ambulatory Visit: Payer: Self-pay | Admitting: Obstetrics & Gynecology

## 2020-07-02 DIAGNOSIS — N644 Mastodynia: Secondary | ICD-10-CM

## 2020-07-09 DIAGNOSIS — L570 Actinic keratosis: Secondary | ICD-10-CM | POA: Diagnosis not present

## 2020-07-09 DIAGNOSIS — L578 Other skin changes due to chronic exposure to nonionizing radiation: Secondary | ICD-10-CM | POA: Diagnosis not present

## 2020-07-09 DIAGNOSIS — L821 Other seborrheic keratosis: Secondary | ICD-10-CM | POA: Diagnosis not present

## 2020-07-12 ENCOUNTER — Ambulatory Visit: Admission: RE | Admit: 2020-07-12 | Payer: PPO | Source: Ambulatory Visit

## 2020-07-12 ENCOUNTER — Ambulatory Visit
Admission: RE | Admit: 2020-07-12 | Discharge: 2020-07-12 | Disposition: A | Discharge status: Home / Self Care | Payer: PPO | Source: Ambulatory Visit | Attending: Obstetrics & Gynecology | Admitting: Obstetrics & Gynecology

## 2020-07-12 ENCOUNTER — Other Ambulatory Visit: Payer: Self-pay

## 2020-07-12 DIAGNOSIS — N644 Mastodynia: Secondary | ICD-10-CM | POA: Diagnosis not present

## 2020-07-12 DIAGNOSIS — R922 Inconclusive mammogram: Secondary | ICD-10-CM | POA: Diagnosis not present

## 2020-07-28 DIAGNOSIS — H43812 Vitreous degeneration, left eye: Secondary | ICD-10-CM | POA: Diagnosis not present

## 2020-07-28 DIAGNOSIS — H5203 Hypermetropia, bilateral: Secondary | ICD-10-CM | POA: Diagnosis not present

## 2020-07-28 DIAGNOSIS — H353111 Nonexudative age-related macular degeneration, right eye, early dry stage: Secondary | ICD-10-CM | POA: Diagnosis not present

## 2020-07-28 DIAGNOSIS — H2513 Age-related nuclear cataract, bilateral: Secondary | ICD-10-CM | POA: Diagnosis not present

## 2020-08-09 ENCOUNTER — Other Ambulatory Visit: Payer: Self-pay | Admitting: Legal Medicine

## 2020-08-09 DIAGNOSIS — E038 Other specified hypothyroidism: Secondary | ICD-10-CM

## 2020-08-20 DIAGNOSIS — H25813 Combined forms of age-related cataract, bilateral: Secondary | ICD-10-CM | POA: Diagnosis not present

## 2020-09-07 DIAGNOSIS — H25812 Combined forms of age-related cataract, left eye: Secondary | ICD-10-CM | POA: Diagnosis not present

## 2020-09-07 DIAGNOSIS — H259 Unspecified age-related cataract: Secondary | ICD-10-CM | POA: Diagnosis not present

## 2020-09-23 LAB — HM DIABETES EYE EXAM

## 2020-10-05 DIAGNOSIS — H25811 Combined forms of age-related cataract, right eye: Secondary | ICD-10-CM | POA: Diagnosis not present

## 2020-10-05 DIAGNOSIS — Z01818 Encounter for other preprocedural examination: Secondary | ICD-10-CM | POA: Diagnosis not present

## 2020-10-08 ENCOUNTER — Encounter: Payer: Self-pay | Admitting: Legal Medicine

## 2020-10-08 ENCOUNTER — Ambulatory Visit (INDEPENDENT_AMBULATORY_CARE_PROVIDER_SITE_OTHER): Payer: PPO | Admitting: Legal Medicine

## 2020-10-08 VITALS — BP 106/60 | HR 74 | Temp 96.8°F | Resp 16 | Ht 61.5 in | Wt 130.0 lb

## 2020-10-08 DIAGNOSIS — E782 Mixed hyperlipidemia: Secondary | ICD-10-CM

## 2020-10-08 DIAGNOSIS — Z23 Encounter for immunization: Secondary | ICD-10-CM

## 2020-10-08 DIAGNOSIS — R7303 Prediabetes: Secondary | ICD-10-CM

## 2020-10-08 DIAGNOSIS — K2100 Gastro-esophageal reflux disease with esophagitis, without bleeding: Secondary | ICD-10-CM

## 2020-10-08 DIAGNOSIS — M810 Age-related osteoporosis without current pathological fracture: Secondary | ICD-10-CM

## 2020-10-08 DIAGNOSIS — E039 Hypothyroidism, unspecified: Secondary | ICD-10-CM

## 2020-10-08 HISTORY — DX: Prediabetes: R73.03

## 2020-10-08 NOTE — Progress Notes (Signed)
Established Patient Office Visit  Subjective:  Patient ID: Kelly Horn, female    DOB: 11/21/1945  Age: 75 y.o. MRN: [id number]  CC:  Chief Complaint  Patient presents with   Hypothyroidism   Hyperlipidemia    HPI Kelly Horn presents for chronic visit  Patient presents with hyperlipidemia.  Compliance with treatment has been good; patient takes medicines as directed, maintains low cholesterol diet, follows up as directed, and maintains exercise regimen.  Patient is using simvastatin without problems.   Patient has HYPOTHYROIDISM.  Diagnosed 10 years ago.  Patient has stable thyroid readings.  Patient is having none.  Last TSH was normal.  continue dosage of thyroid medicine.     Past Medical History:  Diagnosis Date   Age-related osteoporosis without current pathological fracture    GERD with esophagitis    Mixed hyperlipidemia    Other specified hypothyroidism    Type 2 diabetes mellitus with other specified complication ([place])     Past Surgical History:  Procedure Laterality Date   CERVICAL DISC SURGERY     PARTIAL HYSTERECTOMY      Family History  Problem Relation Age of Onset   Cancer Mother    Heart attack Father    Diabetes Other    Arthritis Other     Social History   Socioeconomic History   Marital status: Married    Spouse name: Not on file   Number of children: 2   Years of education: Not on file   Highest education level: Not on file  Occupational History   Occupation: retired  Tobacco Use   Smoking status: Never   Smokeless tobacco: Never  Vaping Use   Vaping Use: Never used  Substance and Sexual Activity   Alcohol use: Not on file   Drug use: Not on file   Sexual activity: Not on file  Other Topics Concern   Not on file  Social History Narrative   Not on file   Social Determinants of Health   Financial Resource Strain: Not on file  Food Insecurity: Not on file  Transportation Needs: Not on file  Physical Activity: Not on  file  Stress: Not on file  Social Connections: Not on file  Intimate Partner Violence: Not on file    Outpatient Medications Prior to Visit  Medication Sig Dispense Refill   Calcium Carbonate-Vit D-Min (CALCIUM 1200 PO) Take 1,200 mg by mouth daily.     Investigational vitamin D 600 UNITS capsule SWOG [id number] Take 600 Units by mouth daily. Take with food.     levothyroxine (SYNTHROID) 50 MCG tablet TAKE 1 TABLET BY MOUTH DAILY 90 tablet 1   simvastatin (ZOCOR) 20 MG tablet TAKE 1 TABLET BY MOUTH DAILY 90 tablet 1   No facility-administered medications prior to visit.    Allergies  Allergen Reactions   Cimetidine     ROS Review of Systems  Constitutional:  Negative for activity change and appetite change.  HENT:  Negative for congestion.   Eyes:  Negative for visual disturbance.  Respiratory:  Negative for chest tightness and shortness of breath.   Cardiovascular:  Negative for chest pain, palpitations and leg swelling.  Gastrointestinal:  Negative for abdominal distention and abdominal pain.  Endocrine: Negative for polyuria.  Genitourinary:  Negative for difficulty urinating and dysuria.  Musculoskeletal:  Negative for arthralgias and back pain.  Skin: Negative.   Neurological: Negative.   Psychiatric/Behavioral: Negative.       Objective:    Physical  Exam Vitals reviewed.  Constitutional:      General: She is not in acute distress.    Appearance: Normal appearance.  HENT:     Right Ear: Tympanic membrane, ear canal and external ear normal.     Left Ear: Tympanic membrane, ear canal and external ear normal.     Mouth/Throat:     Mouth: Mucous membranes are moist.     Pharynx: Oropharynx is clear.  Eyes:     Extraocular Movements: Extraocular movements intact.     Conjunctiva/sclera: Conjunctivae normal.     Pupils: Pupils are equal, round, and reactive to light.  Cardiovascular:     Rate and Rhythm: Normal rate and regular rhythm.     Pulses: Normal pulses.      Heart sounds: No murmur heard.   No gallop.  Pulmonary:     Effort: Pulmonary effort is normal. No respiratory distress.     Breath sounds: Normal breath sounds.  Abdominal:     General: Abdomen is flat. Bowel sounds are normal. There is no distension.     Palpations: Abdomen is soft.     Tenderness: There is no abdominal tenderness.  Musculoskeletal:        General: Normal range of motion.     Cervical back: Normal range of motion and neck supple.     Right lower leg: No edema.     Left lower leg: No edema.  Skin:    General: Skin is warm.     Capillary Refill: Capillary refill takes less than 2 seconds.  Neurological:     General: No focal deficit present.     Mental Status: She is alert and oriented to person, place, and time. Mental status is at baseline.     Gait: Gait normal.     Deep Tendon Reflexes: Reflexes normal.  Psychiatric:        Mood and Affect: Mood normal.        Thought Content: Thought content normal.    BP 106/60   Pulse 74   Temp (!) 96.8 F (36 C)   Resp 16   Ht 5' 1.5" (1.562 m)   Wt 130 lb (59 kg)   BMI 24.17 kg/m  Wt Readings from Last 3 Encounters:  10/08/20 130 lb (59 kg)  06/02/20 129 lb (58.5 kg)  12/17/19 133 lb (60.3 kg)     Health Maintenance Due  Topic Date Due   Hepatitis C Screening  Never done   TETANUS/TDAP  Never done   Zoster Vaccines- Shingrix (1 of 2) Never done   OPHTHALMOLOGY EXAM  01/21/2020   COVID-19 Vaccine (4 - Booster for Moderna series) 04/18/2020   URINE MICROALBUMIN  06/02/2020    There are no preventive care reminders to display for this patient.  Lab Results  Component Value Date   TSH 2.980 10/08/2020   Lab Results  Component Value Date   WBC 4.8 10/08/2020   HGB 14.1 10/08/2020   HCT 42.5 10/08/2020   MCV 93 10/08/2020   PLT 196 10/08/2020   Lab Results  Component Value Date   NA 144 10/08/2020   K 4.0 10/08/2020   CO2 24 10/08/2020   GLUCOSE 100 (H) 10/08/2020   BUN 13 10/08/2020    CREATININE 0.84 10/08/2020   BILITOT 0.5 10/08/2020   ALKPHOS 59 10/08/2020   AST 16 10/08/2020   ALT 14 10/08/2020   PROT 6.6 10/08/2020   ALBUMIN 4.5 10/08/2020   CALCIUM 9.3 10/08/2020  EGFR 72 10/08/2020   Lab Results  Component Value Date   CHOL 133 10/08/2020   Lab Results  Component Value Date   HDL 52 10/08/2020   Lab Results  Component Value Date   LDLCALC 64 10/08/2020   Lab Results  Component Value Date   TRIG 86 10/08/2020   Lab Results  Component Value Date   CHOLHDL 2.6 10/08/2020   Lab Results  Component Value Date   HGBA1C 6.2 (H) 10/08/2020      Assessment & Plan:   Problem List Items Addressed This Visit       Digestive   GERD with esophagitis - Primary Plan of care was formulated today.  he is doing well.  A plan of care was formulated using patient exam, tests and other sources to optimize care using evidence based information.  Recommend no smoking, no eating after supper, avoid fatty foods, elevate Head of bed, avoid tight fitting clothing.  Continue on OTC.      Endocrine   Primary hypothyroidism   Relevant Orders   CBC with Differential/Platelet (Completed)   TSH (Completed) Patient is known to have hypothyroidism and is on treatment with levothyroxine 50 mcg .  Patient was diagnosed 10 years ago.  Other treatment includes none.  Patient is compliant with medicines and last TSH 6 months ago.  Last TSH was normal.      Musculoskeletal and Integument   Age-related osteoporosis without current pathological fracture AN INDIVIDUAL CARE PLAN for osteoporosis was established and reinforced today.  The patient's status was assessed using clinical findings on exam, labs, and other diagnostic testing. Patient's success at meeting treatment goals based on disease specific evidence-bassed guidelines and found to be in fair control. RECOMMENDATIONS include maintaining present medicines and treatment.      Other   Mixed hyperlipidemia   Relevant  Orders   Lipid panel (Completed) AN INDIVIDUAL CARE PLAN for hyperlipidemia/ cholesterol was established and reinforced today.  The patient's status was assessed using clinical findings on exam, lab and other diagnostic tests. The patient's disease status was assessed based on evidence-based guidelines and found to be fair controlled. MEDICATIONS were reviewed. SELF MANAGEMENT GOALS have been discussed and patient's success at attaining the goal of low cholesterol was assessed. RECOMMENDATION given include regular exercise 3 days a week and low cholesterol/low fat diet. CLINICAL SUMMARY including written plan to identify barriers unique to the patient due to social or economic  reasons was discussed.    Prediabetes   Relevant Orders   CBC with Differential/Platelet (Completed)   Comprehensive metabolic panel (Completed)   Hemoglobin A1c (Completed) Patient has prediabetes and is on diet   Other Visit Diagnoses     Need for immunization against influenza       Relevant Orders   Flu Vaccine QUAD High Dose(Fluad) (Completed)         Follow-up: Return in about 4 months (around 02/07/2021) for fasting.    Reinaldo Meeker, MD

## 2020-10-09 LAB — TSH: TSH: 2.98 u[IU]/mL (ref 0.450–4.500)

## 2020-10-09 LAB — COMPREHENSIVE METABOLIC PANEL
ALT: 14 IU/L (ref 0–32)
AST: 16 IU/L (ref 0–40)
Albumin/Globulin Ratio: 2.1 (ref 1.2–2.2)
Albumin: 4.5 g/dL (ref 3.7–4.7)
Alkaline Phosphatase: 59 IU/L (ref 44–121)
BUN/Creatinine Ratio: 15 (ref 12–28)
BUN: 13 mg/dL (ref 8–27)
Bilirubin Total: 0.5 mg/dL (ref 0.0–1.2)
CO2: 24 mmol/L (ref 20–29)
Calcium: 9.3 mg/dL (ref 8.7–10.3)
Chloride: 105 mmol/L (ref 96–106)
Creatinine, Ser: 0.84 mg/dL (ref 0.57–1.00)
Globulin, Total: 2.1 g/dL (ref 1.5–4.5)
Glucose: 100 mg/dL — ABNORMAL HIGH (ref 65–99)
Potassium: 4 mmol/L (ref 3.5–5.2)
Sodium: 144 mmol/L (ref 134–144)
Total Protein: 6.6 g/dL (ref 6.0–8.5)
eGFR: 72 mL/min/{1.73_m2} (ref >59)

## 2020-10-09 LAB — CBC WITH DIFFERENTIAL/PLATELET
Basophils Absolute: 0 10*3/uL (ref 0.0–0.2)
Basos: 0 %
EOS (ABSOLUTE): 0.2 10*3/uL (ref 0.0–0.4)
Eos: 5 %
Hematocrit: 42.5 % (ref 34.0–46.6)
Hemoglobin: 14.1 g/dL (ref 11.1–15.9)
Immature Grans (Abs): 0 10*3/uL (ref 0.0–0.1)
Immature Granulocytes: 0 %
Lymphocytes Absolute: 1.8 10*3/uL (ref 0.7–3.1)
Lymphs: 37 %
MCH: 30.7 pg (ref 26.6–33.0)
MCHC: 33.2 g/dL (ref 31.5–35.7)
MCV: 93 fL (ref 79–97)
Monocytes Absolute: 0.3 10*3/uL (ref 0.1–0.9)
Monocytes: 6 %
Neutrophils Absolute: 2.5 10*3/uL (ref 1.4–7.0)
Neutrophils: 52 %
Platelets: 196 10*3/uL (ref 150–450)
RBC: 4.59 x10E6/uL (ref 3.77–5.28)
RDW: 12.3 % (ref 11.7–15.4)
WBC: 4.8 10*3/uL (ref 3.4–10.8)

## 2020-10-09 LAB — LIPID PANEL
Chol/HDL Ratio: 2.6 ratio (ref 0.0–4.4)
Cholesterol, Total: 133 mg/dL (ref 100–199)
HDL: 52 mg/dL (ref >39)
LDL Chol Calc (NIH): 64 mg/dL (ref 0–99)
Triglycerides: 86 mg/dL (ref 0–149)
VLDL Cholesterol Cal: 17 mg/dL (ref 5–40)

## 2020-10-09 LAB — CARDIOVASCULAR RISK ASSESSMENT

## 2020-10-09 LAB — HEMOGLOBIN A1C
Est. average glucose Bld gHb Est-mCnc: 131 mg/dL
Hgb A1c MFr Bld: 6.2 % — ABNORMAL HIGH (ref 4.8–5.6)

## 2020-10-10 NOTE — Progress Notes (Signed)
Cbc normal, glucose 100, kidney tests normal, liver tests normal, A1c 6.2 good, cholesterol normal, TSH 2.89 normal lp

## 2020-10-12 DIAGNOSIS — H259 Unspecified age-related cataract: Secondary | ICD-10-CM | POA: Diagnosis not present

## 2020-10-12 DIAGNOSIS — H52223 Regular astigmatism, bilateral: Secondary | ICD-10-CM | POA: Diagnosis not present

## 2020-10-12 DIAGNOSIS — H25812 Combined forms of age-related cataract, left eye: Secondary | ICD-10-CM | POA: Diagnosis not present

## 2020-10-12 DIAGNOSIS — H25811 Combined forms of age-related cataract, right eye: Secondary | ICD-10-CM | POA: Diagnosis not present

## 2020-11-10 ENCOUNTER — Other Ambulatory Visit: Payer: Self-pay | Admitting: Legal Medicine

## 2020-11-10 DIAGNOSIS — E782 Mixed hyperlipidemia: Secondary | ICD-10-CM

## 2020-11-15 ENCOUNTER — Ambulatory Visit (INDEPENDENT_AMBULATORY_CARE_PROVIDER_SITE_OTHER): Payer: PPO | Admitting: Legal Medicine

## 2020-11-15 ENCOUNTER — Encounter: Payer: Self-pay | Admitting: Legal Medicine

## 2020-11-15 VITALS — BP 100/60 | HR 73 | Temp 97.8°F | Resp 16 | Ht 61.5 in | Wt 131.0 lb

## 2020-11-15 DIAGNOSIS — R519 Headache, unspecified: Secondary | ICD-10-CM | POA: Diagnosis not present

## 2020-11-15 LAB — POC COVID19 BINAXNOW: SARS Coronavirus 2 Ag: NEGATIVE

## 2020-11-15 MED ORDER — TRIAMCINOLONE ACETONIDE 40 MG/ML IJ SUSP
80.0000 mg | Freq: Once | INTRAMUSCULAR | Status: AC
  Administered 2020-11-15: 80 mg via INTRAMUSCULAR

## 2020-11-15 MED ORDER — AMOXICILLIN-POT CLAVULANATE 875-125 MG PO TABS: 1.0000 | ORAL_TABLET | Freq: Two times a day (BID) | ORAL | 0 refills | Status: DC

## 2020-11-15 NOTE — Progress Notes (Signed)
Acute Office Visit  Subjective:    Patient ID: Kelly Horn, female    DOB: March 19, 1945, 75 y.o.   MRN: [id number]  Chief Complaint  Patient presents with   Headache   Sinusitis    HPI: Patient is in today for Sinus pressure, headache, and nasal congestion. Patient denied fever, chills, body aches. Ears popping, using flonase.  Lasting 2 weeks.  Past Medical History:  Diagnosis Date   Age-related osteoporosis without current pathological fracture    GERD with esophagitis    Mixed hyperlipidemia    Other specified hypothyroidism    Type 2 diabetes mellitus with other specified complication ([place])     Past Surgical History:  Procedure Laterality Date   CERVICAL DISC SURGERY     PARTIAL HYSTERECTOMY      Family History  Problem Relation Age of Onset   Cancer Mother    Heart attack Father    Diabetes Other    Arthritis Other     Social History   Socioeconomic History   Marital status: Married    Spouse name: Not on file   Number of children: 2   Years of education: Not on file   Highest education level: Not on file  Occupational History   Occupation: retired  Tobacco Use   Smoking status: Never   Smokeless tobacco: Never  Vaping Use   Vaping Use: Never used  Substance and Sexual Activity   Alcohol use: Not on file   Drug use: Not on file   Sexual activity: Not on file  Other Topics Concern   Not on file  Social History Narrative   Not on file   Social Determinants of Health   Financial Resource Strain: Not on file  Food Insecurity: Not on file  Transportation Needs: Not on file  Physical Activity: Not on file  Stress: Not on file  Social Connections: Not on file  Intimate Partner Violence: Not on file    Outpatient Medications Prior to Visit  Medication Sig Dispense Refill   Calcium Carbonate-Vit D-Min (CALCIUM 1200 PO) Take 1,200 mg by mouth daily.     Investigational vitamin D 600 UNITS capsule SWOG S0812 Take 600 Units by mouth daily. Take  with food.     levothyroxine (SYNTHROID) 50 MCG tablet TAKE 1 TABLET BY MOUTH DAILY 90 tablet 1   simvastatin (ZOCOR) 20 MG tablet TAKE 1 TABLET BY MOUTH DAILY 90 tablet 2   No facility-administered medications prior to visit.    No Active Allergies  Review of Systems  Constitutional:  Negative for chills, fatigue and fever.  HENT:  Positive for congestion, facial swelling, postnasal drip, rhinorrhea and sinus pain. Negative for ear pain and sore throat.   Eyes:  Negative for visual disturbance.  Respiratory:  Negative for cough and shortness of breath.   Cardiovascular:  Negative for chest pain.  Gastrointestinal:  Negative for diarrhea, nausea and vomiting.  Genitourinary: Negative.   Musculoskeletal: Negative.  Negative for myalgias.  Skin: Negative.   Neurological:  Positive for headaches.  Psychiatric/Behavioral: Negative.        Objective:    Physical Exam Vitals reviewed.  Constitutional:      General: She is not in acute distress.    Appearance: She is well-developed.  HENT:     Head: Normocephalic and atraumatic.     Right Ear: Tympanic membrane, ear canal and external ear normal.     Left Ear: Tympanic membrane, ear canal and external ear normal.  Nose: Congestion present.     Mouth/Throat:     Pharynx: Posterior oropharyngeal erythema present.  Eyes:     Extraocular Movements: Extraocular movements intact.     Conjunctiva/sclera: Conjunctivae normal.     Pupils: Pupils are equal, round, and reactive to light.  Cardiovascular:     Rate and Rhythm: Normal rate and regular rhythm.     Pulses: Normal pulses.     Heart sounds: No murmur heard.   No gallop.  Pulmonary:     Effort: Pulmonary effort is normal. No respiratory distress.     Breath sounds: Normal breath sounds. No wheezing.  Abdominal:     General: Abdomen is flat. Bowel sounds are normal. There is no distension.     Palpations: Abdomen is soft.     Tenderness: There is no abdominal tenderness.   Musculoskeletal:        General: Normal range of motion.     Cervical back: Normal range of motion.  Skin:    General: Skin is warm.     Capillary Refill: Capillary refill takes less than 2 seconds.  Neurological:     General: No focal deficit present.     Mental Status: She is alert.    BP 100/60   Pulse 73   Temp 97.8 F (36.6 C)   Resp 16   Ht 5' 1.5" (1.562 m)   Wt 131 lb (59.4 kg)   SpO2 97%   BMI 24.35 kg/m  Wt Readings from Last 3 Encounters:  11/15/20 131 lb (59.4 kg)  10/08/20 130 lb (59 kg)  06/02/20 129 lb (58.5 kg)    Health Maintenance Due  Topic Date Due   Pneumonia Vaccine 38+ Years old (1 - PCV) Never done   Hepatitis C Screening  Never done   TETANUS/TDAP  Never done   Zoster Vaccines- Shingrix (1 of 2) Never done   OPHTHALMOLOGY EXAM  01/21/2020   COVID-19 Vaccine (4 - Booster for Moderna series) 02/13/2020   URINE MICROALBUMIN  06/02/2020    There are no preventive care reminders to display for this patient.   Lab Results  Component Value Date   TSH 2.980 10/08/2020   Lab Results  Component Value Date   WBC 4.8 10/08/2020   HGB 14.1 10/08/2020   HCT 42.5 10/08/2020   MCV 93 10/08/2020   PLT 196 10/08/2020   Lab Results  Component Value Date   NA 144 10/08/2020   K 4.0 10/08/2020   CO2 24 10/08/2020   GLUCOSE 100 (H) 10/08/2020   BUN 13 10/08/2020   CREATININE 0.84 10/08/2020   BILITOT 0.5 10/08/2020   ALKPHOS 59 10/08/2020   AST 16 10/08/2020   ALT 14 10/08/2020   PROT 6.6 10/08/2020   ALBUMIN 4.5 10/08/2020   CALCIUM 9.3 10/08/2020   EGFR 72 10/08/2020   Lab Results  Component Value Date   CHOL 133 10/08/2020   Lab Results  Component Value Date   HDL 52 10/08/2020   Lab Results  Component Value Date   LDLCALC 64 10/08/2020   Lab Results  Component Value Date   TRIG 86 10/08/2020   Lab Results  Component Value Date   CHOLHDL 2.6 10/08/2020   Lab Results  Component Value Date   HGBA1C 6.2 (H) 10/08/2020        Assessment & Plan:   Problem List Items Addressed This Visit   None Visit Diagnoses     Sinus headache    -  Primary  Relevant Medications   amoxicillin-clavulanate (AUGMENTIN) 875-125 MG tablet   triamcinolone acetonide (KENALOG-40) injection 80 mg (Completed)   Other Relevant Orders   POC COVID-19 (Completed)  Treat with kenalog injectio for congestion and antibiotic  Meds ordered this encounter  Medications   amoxicillin-clavulanate (AUGMENTIN) 875-125 MG tablet    Sig: Take 1 tablet by mouth 2 (two) times daily.    Dispense:  20 tablet    Refill:  0   triamcinolone acetonide (KENALOG-40) injection 80 mg     Orders Placed This Encounter  Procedures   POC COVID-19      Follow-up: Return if symptoms worsen or fail to improve.  An After Visit Summary was printed and given to the patient.  Reinaldo Meeker, MD [facility] [phone]

## 2020-12-23 ENCOUNTER — Ambulatory Visit: Payer: PPO

## 2020-12-30 DIAGNOSIS — K5904 Chronic idiopathic constipation: Secondary | ICD-10-CM | POA: Diagnosis not present

## 2021-01-04 ENCOUNTER — Other Ambulatory Visit: Payer: Self-pay | Admitting: Obstetrics & Gynecology

## 2021-01-04 DIAGNOSIS — Z1231 Encounter for screening mammogram for malignant neoplasm of breast: Secondary | ICD-10-CM

## 2021-02-07 ENCOUNTER — Ambulatory Visit
Admission: RE | Admit: 2021-02-07 | Discharge: 2021-02-07 | Disposition: A | Discharge status: Home / Self Care | Payer: PPO | Source: Ambulatory Visit | Attending: Obstetrics & Gynecology | Admitting: Obstetrics & Gynecology

## 2021-02-07 DIAGNOSIS — Z1231 Encounter for screening mammogram for malignant neoplasm of breast: Secondary | ICD-10-CM | POA: Diagnosis not present

## 2021-02-08 ENCOUNTER — Other Ambulatory Visit: Payer: Self-pay | Admitting: Legal Medicine

## 2021-02-08 DIAGNOSIS — E038 Other specified hypothyroidism: Secondary | ICD-10-CM

## 2021-02-10 DIAGNOSIS — K5904 Chronic idiopathic constipation: Secondary | ICD-10-CM | POA: Diagnosis not present

## 2021-02-11 ENCOUNTER — Other Ambulatory Visit: Payer: Self-pay

## 2021-02-11 ENCOUNTER — Ambulatory Visit: Payer: PPO | Admitting: Legal Medicine

## 2021-02-11 ENCOUNTER — Encounter: Payer: Self-pay | Admitting: Nurse Practitioner

## 2021-02-11 ENCOUNTER — Ambulatory Visit (INDEPENDENT_AMBULATORY_CARE_PROVIDER_SITE_OTHER): Payer: PPO | Admitting: Nurse Practitioner

## 2021-02-11 VITALS — BP 130/70 | HR 71 | Temp 96.9°F | Ht 61.0 in | Wt 130.0 lb

## 2021-02-11 DIAGNOSIS — E782 Mixed hyperlipidemia: Secondary | ICD-10-CM | POA: Diagnosis not present

## 2021-02-11 DIAGNOSIS — M810 Age-related osteoporosis without current pathological fracture: Secondary | ICD-10-CM

## 2021-02-11 DIAGNOSIS — R7303 Prediabetes: Secondary | ICD-10-CM

## 2021-02-11 DIAGNOSIS — K2100 Gastro-esophageal reflux disease with esophagitis, without bleeding: Secondary | ICD-10-CM | POA: Diagnosis not present

## 2021-02-11 MED ORDER — OMEPRAZOLE 20 MG PO CPDR: 20.0000 mg | DELAYED_RELEASE_CAPSULE | Freq: Every day | ORAL | 3 refills | Status: DC

## 2021-02-11 NOTE — Patient Instructions (Addendum)
Continue medications Begin Omeprazole 20 mg daily before breakfast for heartburn We will call you with lab results and osteoporosis scan Follow-up in 34-month  Preventive Care 77 Years and Older, Female Preventive care refers to lifestyle choices and visits with your health care provider that can promote health and wellness. Preventive care visits are also called wellness exams. What can I expect for my preventive care visit? Counseling Your health care provider may ask you questions about your: Medical history, including: Past medical problems. Family medical history. Pregnancy and menstrual history. History of falls. Current health, including: Memory and ability to understand (cognition). Emotional well-being. Home life and relationship well-being. Sexual activity and sexual health. Lifestyle, including: Alcohol, nicotine or tobacco, and drug use. Access to firearms. Diet, exercise, and sleep habits. Work and work [employer] Sunscreen use. Safety issues such as seatbelt and bike helmet use. Physical exam Your health care provider will check your: Height and weight. These may be used to calculate your BMI (body mass index). BMI is a measurement that tells if you are at a healthy weight. Waist circumference. This measures the distance around your waistline. This measurement also tells if you are at a healthy weight and may help predict your risk of certain diseases, such as type 2 diabetes and high blood pressure. Heart rate and blood pressure. Body temperature. Skin for abnormal spots. What immunizations do I need? Vaccines are usually given at various ages, according to a schedule. Your health care provider will recommend vaccines for you based on your age, medical history, and lifestyle or other factors, such as travel or where you work. What tests do I need? Screening Your health care provider may recommend screening tests for certain conditions. This may include: Lipid  and cholesterol levels. Hepatitis C test. Hepatitis B test. HIV (human immunodeficiency virus) test. STI (sexually transmitted infection) testing, if you are at risk. Lung cancer screening. Colorectal cancer screening. Diabetes screening. This is done by checking your blood sugar (glucose) after you have not eaten for a while (fasting). Mammogram. Talk with your health care provider about how often you should have regular mammograms. BRCA-related cancer screening. This may be done if you have a family history of breast, ovarian, tubal, or peritoneal cancers. Bone density scan. This is done to screen for osteoporosis. Talk with your health care provider about your test results, treatment options, and if necessary, the need for more tests. Follow these instructions at home: Eating and drinking  Eat a diet that includes fresh fruits and vegetables, whole grains, lean protein, and low-fat dairy products. Limit your intake of foods with high amounts of sugar, saturated fats, and salt. Take vitamin and mineral supplements as recommended by your health care provider. Do not drink alcohol if your health care provider tells you not to drink. If you drink alcohol: Limit how much you have to 0-1 drink a day. Know how much alcohol is in your drink. In the [place] one drink equals one 12 oz bottle of beer (355 mL), one 5 oz glass of wine (148 mL), or one 1 oz glass of hard liquor (44 mL). Lifestyle Brush your teeth every morning and night with fluoride toothpaste. Floss one time each day. Exercise for at least 30 minutes 5 or more days each week. Do not use any products that contain nicotine or tobacco. These products include cigarettes, chewing tobacco, and vaping devices, such as e-cigarettes. If you need help quitting, ask your health care provider. Do not use drugs. If you are  sexually active, practice safe sex. Use a condom or other form of protection in order to prevent STIs. Take aspirin only as  told by your health care provider. Make sure that you understand how much to take and what form to take. Work with your health care provider to find out whether it is safe and beneficial for you to take aspirin daily. Ask your health care provider if you need to take a cholesterol-lowering medicine (statin). Find healthy ways to manage stress, such as: Meditation, yoga, or listening to music. Journaling. Talking to a trusted person. Spending time with friends and family. Minimize exposure to UV radiation to reduce your risk of skin cancer. Safety Always wear your seat belt while driving or riding in a vehicle. Do not drive: If you have been drinking alcohol. Do not ride with someone who has been drinking. When you are tired or distracted. While texting. If you have been using any mind-altering substances or drugs. Wear a helmet and other protective equipment during sports activities. If you have firearms in your house, make sure you follow all gun safety procedures. What's next? Visit your health care provider once a year for an annual wellness visit. Ask your health care provider how often you should have your eyes and teeth checked. Stay up to date on all vaccines. This information is not intended to replace advice given to you by your health care provider. Make sure you discuss any questions you have with your health care provider. Document Revised: 06/30/2020 Document Reviewed: 06/30/2020 Elsevier Patient Education  2022 [place] for Osteoporosis Osteoporosis causes your bones to become weak and brittle. This puts you at greater risk for bone breaks (fractures) from small bumps or falls. Making changes to your diet and increasing your physical activity can help strengthen your bones and improve your overall health. Calcium and vitamin D are nutrients that play an important role in bone health. Vitamin D helps your body use calcium and strengthen bones. It is important  to get enough calcium and vitamin D as part of your eating plan for osteoporosis. What are tips for following this plan? Reading food labels Try to get at least 1,000 milligrams (mg) of calcium each day. Look for foods that have at least 50 mg of calcium per serving. Talk with your health care provider about taking a calcium supplement if you do not get enough calcium from food. Do not have more than 2,500 mg of calcium each day. This is the upper limit for food and nutritional supplements combined. Too much calcium may cause constipation and prevent you from absorbing other important nutrients. Choose foods that contain vitamin D. Take a daily vitamin supplement that contains 800-1,000 international units (IU) of vitamin D. The amount may be different depending on your age, body weight, and where you live. Talk with your dietitian or health care provider about how much vitamin D is right for you. Avoid foods that have more than 300 mg of sodium per serving. Too much sodium can cause your body to lose calcium. Talk with your dietitian or health care provider about how much sodium you are allowed each day. Shopping Do not buy foods with added salt, including: Salted snacks. Angie Fava. Canned soups. Canned meats. Processed meats, such as bacon or precooked or cured meat like sausages or meat loaves. Smoked fish. Meal planning Eat balanced meals that contain protein foods, fruits and vegetables, and foods rich in calcium and vitamin D. Eat at least 5 servings  of fruits and vegetables each day. Eat 5-6 oz (142-170 g) of lean meat, poultry, fish, eggs, or beans each day. Lifestyle Do not use any products that contain nicotine or tobacco, such as cigarettes, e-cigarettes, and chewing tobacco. If you need help quitting, ask your health care provider. If your health care provider recommends that you lose weight: Work with a dietitian to develop an eating plan that will help you reach your desired  weight goal. Exercise for at least 30 minutes a day, 5 or more days a week, or as told by your health care provider. Work with a physical therapist to develop an exercise plan that includes flexibility, balance, and strength exercises. Do not focus only on aerobic exercise. Do not drink alcohol if: Your health care provider tells you not to drink. You are pregnant, may be pregnant, or are planning to become pregnant. If you drink alcohol: Limit how much you use to: 0-1 drink a day for women. 0-2 drinks a day for men. Be aware of how much alcohol is in your drink. In the [place] one drink equals one 12 oz bottle of beer (355 mL), one 5 oz glass of wine (148 mL), or one 1 oz glass of hard liquor (44 mL). What foods should I eat? Foods high in calcium  Yogurt. Yogurt with fruit. Milk. Evaporated skim milk. Dry milk powder. Calcium-fortified orange juice. Parmesan cheese. Part-skim ricotta cheese. Natural hard cheese. Cream cheese. Cottage cheese. Canned sardines. Canned salmon. Calcium-treated tofu. Calcium-fortified cereal bar. Calcium-fortified cereal. Calcium-fortified graham crackers. Cooked collard greens. Turnip greens. Broccoli. Kale. Almonds. White beans. Corn tortilla. Foods high in vitamin D Cod liver oil. Fatty fish, such as tuna, mackerel, and salmon. Milk. Fortified soy milk. Fortified fruit juice. Yogurt. Margarine. Egg yolks. Foods high in protein Beef. Lamb. Pork tenderloin. Chicken breast. Tuna (canned). Fish fillet. Tofu. Cooked soy beans. Soy patty. Beans (canned or cooked). Cottage cheese. Yogurt. Peanut butter. Pumpkin seeds. Nuts. Sunflower seeds. Hard cheese. Milk or other milk products, such as soy milk. The items listed above may not be a complete list of foods and beverages you can eat. Contact a dietitian for more options. Summary Calcium and vitamin D are nutrients that play an important role in bone health and are an important part of your eating  plan for osteoporosis. Eat balanced meals that contain protein foods, fruits and vegetables, and foods rich in calcium and vitamin D. Avoid foods that have more than 300 mg of sodium per serving. Too much sodium can cause your body to lose calcium. Exercise is an important part of prevention and treatment of osteoporosis. Aim for at least 30 minutes a day, 5 days a week. This information is not intended to replace advice given to you by your health care provider. Make sure you discuss any questions you have with your health care provider. Document Revised: 06/19/2019 Document Reviewed: 06/19/2019 Elsevier Patient Education  [place] for Aging Adults Your bones do more than support your body. They also store calcium. The inside of your bones (marrow) makes blood cells. Maintaining bone health becomes more important as you age because your bones replace all their cells about every 10 years. Around age 58, it gets harder to replace those cells, and bones can become weak. Weak bones can lead to osteoporosis and breaks (fractures). Falls also become more likely, which can cause fractures. The good news is that, with diet and exercise, you can improve and maintain your bone health  at any age. How to eat for bone health A balanced diet can supply many of the vitamins, minerals, and proteins you need for bone health. Older adults need to make sure they get enough calcium, vitamin D, and magnesium. You may need more of these as you age. Calcium Calcium is the most important (essential) mineral for bone health. The daily requirement for calcium for adult men aged 60-70 years is 1,000 mg. For adult women aged 105-70 years, it is 1,200 mg. At age 38 or older, it is 1,200 mg for both men and women. Sources of calcium in your diet include: Dairy foods like milk, yogurt, and cheese. Dairy foods are the best sources. If you cannot eat dairy, you may need a calcium supplement. Leafy,  dark green vegetables. These include collard greens, kale, broccoli, bok choy, and okra. Fatty fish, like sardines and canned salmon with bones. Almonds. Tofu.  Vitamin D You need vitamin D for bone health because it helps your body absorb calcium from your diet. It is not found naturally in many foods. Many people can benefit from taking a supplement. The daily requirement for vitamin D at age 31 or older is 600-1,000 international units (IU). Dietary sources for vitamin D include: Fatty fish, such as swordfish, salmon, sardine, and mackerel. Foods that have vitamin D added to them (are fortified), like cereal and dairy products. Egg yolks. Magnesium Magnesium helps your body use both calcium and vitamin D. The recommended daily intake for adult men is 400-420 mg. For adult women, it is 310-320 mg. Dietary sources of magnesium include: Green vegetables, such as collard greens, kale, bok choy, and okra. Poppy, sesame, and chia seeds. Legumes, including peas and beans. Whole grains. Avocados. Nuts. If you drink alcohol regularly or take a type of antacid called a proton pump inhibitor, you might benefit from a magnesium supplement. How to exercise for bone health Exercise is important for bone health because it strengthens bones and muscles. Bones are living organs that get stronger when you exercise them, just like your heart and other muscles. Muscles support your bones and protect your joints. Strong muscles also help prevent bone loss and falls. Weight-bearing exercises and resistance exercises are the two types of exercise that are most important for bone health. Weight-bearing exercises include running or walking, climbing stairs, and playing sports like tennis. Resistance exercises include those done using free weights, weight machines, or resistance bands. Try to get at least 30 minutes of exercise every day. You can also include stretching, balance, and flexibility exercises, like  yoga or tai chi. These lower your risk of falls. Follow these instructions at home Ask your health care provider if: You need a bone density test. This is especially important for women older than 32 and men older than 70. You have been losing height. Loss of height may be a sign of weakening bones in your spine. Any of your medicines or medical conditions could affect your bone health. He or she could recommend an exercise program that is safe for you. Be sure it includes both weight-bearing and resistance exercises. Do not use any products that contain nicotine or tobacco. These products include cigarettes, chewing tobacco, and vaping devices, such as e-cigarettes. These can reduce bone density. If you need help quitting, ask your health care provider. Drink alcohol in moderation. Alcohol reduces bone density and increases your risk for falls. If you drink alcohol: Limit how much you have to: 0-1 drink a day for women who  are not pregnant. 0-2 drinks a day for men. Know how much alcohol is in a drink. In the [place] one drink equals one 12 oz bottle of beer (355 mL), one 5 oz glass of wine (148 mL), or one 1 oz glass of hard liquor (44 mL). Take over-the-counter and prescription medicines only as told by your health care provider. Ask your health care provider if you could benefit from taking calcium, vitamin D, or magnesium supplements. Keep all follow-up visits. This is important. Where to find more information American Bone Health: CardKnowledge.fi American Academy of Orthopaedic Surgeons: orthoinfo.[place]: bones.SouthExposed.es Summary Maintaining your bone health becomes more important as you age. You can improve and maintain your bone health with diet and exercise at any age. A balanced diet can supply many of the vitamins, minerals, and proteins you need for bone health. Older adults need to make sure they get enough calcium, vitamin D, and  magnesium. Ask your health care provider if you could benefit from taking calcium, vitamin D, or magnesium supplements. Exercise is important for bone health because it strengthens bones and muscles. Do both weight-bearing and resistance exercises. This information is not intended to replace advice given to you by your health care provider. Make sure you discuss any questions you have with your health care provider. Document Revised: 06/16/2020 Document Reviewed: 06/16/2020 Elsevier Patient Education  [place].

## 2021-02-11 NOTE — Progress Notes (Signed)
Subjective:  Patient ID: Kelly Horn, female    DOB: 31-May-1945  Age: 76 y.o. MRN: [id number]  CHIEF COMPLAINT: Hyperlipidemia Prediabetes GERD  HPI  Kelly Horn is a 76 year old Caucasian female that presents for follow-up of hyperlipidemia, prediabetes,  and GERD. She has a history of osteoporosis. She cannot recall last DEXA scan. Current treatment is Calcium with Vit D. Recently underwent bilateral cataract surgery. She consumes a heart healthy diet and exercises regularly, walking. She has lost two pounds unintentionally since last visit. States her appetite has declined slightly.   Lipid/Cholesterol, Follow-up  Last lipid panel Other pertinent labs  Lab Results  Component Value Date   CHOL 133 10/08/2020   HDL 52 10/08/2020   LDLCALC 64 10/08/2020   TRIG 86 10/08/2020   CHOLHDL 2.6 10/08/2020   Lab Results  Component Value Date   ALT 14 10/08/2020   AST 16 10/08/2020   PLT 196 10/08/2020   TSH 2.980 10/08/2020     She was last seen for this 4 months ago.  Management since that visit includes Simvastatin.  She reports excellent compliance with treatment. She is not having side effects.   Symptoms: No chest pain No chest pressure/discomfort  No dyspnea No lower extremity edema  No numbness or tingling of extremity No orthopnea  No palpitations No paroxysmal nocturnal dyspnea  No speech difficulty No syncope   Current diet: heart healthy  Current exercise: walking  The 10-year ASCVD risk score (Arnett DK, et al., 2019) is: 20.4%  Prediabetes, Follow-up  Lab Results  Component Value Date   HGBA1C 6.2 (H) 10/08/2020   HGBA1C 6.1 (H) 06/02/2020   HGBA1C 6.2 (H) 12/04/2019   GLUCOSE 100 (H) 10/08/2020   GLUCOSE 107 (H) 06/02/2020   GLUCOSE 100 (H) 12/04/2019    Last seen for for this6 months ago.  Management since that visit includes heart healthy diet. Current symptoms include none and have been stable.  Prior visit with dietician: no Current diet: in  general, a "healthy" diet   Current exercise: walking  Pertinent Labs:    Component Value Date/Time   CHOL 133 10/08/2020 0846   TRIG 86 10/08/2020 0846   CHOLHDL 2.6 10/08/2020 0846   CREATININE 0.84 10/08/2020 0846    Wt Readings from Last 3 Encounters:  02/11/21 130 lb (59 kg)  11/15/20 131 lb (59.4 kg)  10/08/20 130 lb (59 kg)     GERD, Follow up:  The patient was last seen for GERD 6 months ago. Current treatment is avoiding food triggers.  She reports excellent compliance with treatment. She is not having side effects. .  She IS experiencing heartburn. She is NOT experiencing belching and eructation    Current Outpatient Medications on File Prior to Visit  Medication Sig Dispense Refill   Calcium Carbonate-Vit D-Min (CALCIUM 1200 PO) Take 1,200 mg by mouth daily.     Investigational vitamin D 600 UNITS capsule SWOG S0812 Take 600 Units by mouth daily. Take with food.     levothyroxine (SYNTHROID) 50 MCG tablet TAKE 1 TABLET BY MOUTH DAILY 90 tablet 2   simvastatin (ZOCOR) 20 MG tablet TAKE 1 TABLET BY MOUTH DAILY 90 tablet 2   No current facility-administered medications on file prior to visit.   Past Medical History:  Diagnosis Date   Age-related osteoporosis without current pathological fracture    GERD with esophagitis    Mixed hyperlipidemia    Other specified hypothyroidism    Type 2 diabetes mellitus with other specified  complication [facility])    Past Surgical History:  Procedure Laterality Date   CERVICAL DISC SURGERY     PARTIAL HYSTERECTOMY      Family History  Problem Relation Age of Onset   Cancer Mother    Heart attack Father    Diabetes Other    Arthritis Other    Social History   Socioeconomic History   Marital status: Married    Spouse name: Not on file   Number of children: 2   Years of education: Not on file   Highest education level: Not on file  Occupational History   Occupation: retired  Tobacco Use   Smoking status: Never    Smokeless tobacco: Never  Vaping Use   Vaping Use: Never used  Substance and Sexual Activity   Alcohol use: Not on file   Drug use: Not on file   Sexual activity: Not on file  Other Topics Concern   Not on file  Social History Narrative   Not on file   Social Determinants of Health   Financial Resource Strain: Not on file  Food Insecurity: Not on file  Transportation Needs: Not on file  Physical Activity: Not on file  Stress: Not on file  Social Connections: Not on file    Review of Systems  Constitutional:  Negative for chills, fatigue and fever.  HENT:  Positive for congestion. Negative for ear pain, rhinorrhea and sore throat.   Respiratory:  Negative for cough and shortness of breath.   Cardiovascular:  Negative for chest pain.  Gastrointestinal:  Negative for abdominal pain, constipation, diarrhea, nausea and vomiting.  Genitourinary:  Negative for dysuria and urgency.  Musculoskeletal:  Positive for arthralgias. Negative for back pain and myalgias.  Neurological:  Negative for dizziness, weakness, light-headedness and headaches.  Psychiatric/Behavioral:  Negative for dysphoric mood. The patient is not nervous/anxious.     Objective:  Pulse 71    Temp (!) 96.9 F (36.1 C)    Ht 5\' 1"  (1.549 m)    Wt 130 lb (59 kg)    SpO2 100%    BMI 24.56 kg/m  BP 130/70    Pulse 71    Temp (!) 96.9 F (36.1 C)    Ht 5\' 1"  (1.549 m)    Wt 130 lb (59 kg)    SpO2 100%    BMI 24.56 kg/m    BP/Weight 02/11/2021 11/15/2020 04/08/4008  Systolic BP - 272 536  Diastolic BP - 60 60  Wt. (Lbs) 130 131 130  BMI 24.56 24.35 24.17    Physical Exam Vitals reviewed.  Constitutional:      Appearance: Normal appearance.  HENT:     Head: Normocephalic.     Right Ear: Tympanic membrane normal.     Left Ear: Tympanic membrane normal.     Nose: Nose normal.     Mouth/Throat:     Mouth: Mucous membranes are moist.  Eyes:     Pupils: Pupils are equal, round, and reactive to light.   Cardiovascular:     Rate and Rhythm: Normal rate and regular rhythm.     Pulses: Normal pulses.     Heart sounds: Normal heart sounds.  Pulmonary:     Effort: Pulmonary effort is normal.     Breath sounds: Normal breath sounds.  Abdominal:     General: Bowel sounds are normal.     Palpations: Abdomen is soft.  Musculoskeletal:        General: Normal range of motion.  Cervical back: Neck supple.  Skin:    General: Skin is warm and dry.     Capillary Refill: Capillary refill takes less than 2 seconds.  Neurological:     General: No focal deficit present.     Mental Status: She is alert and oriented to person, place, and time.  Psychiatric:        Mood and Affect: Mood normal.        Behavior: Behavior normal.        Lab Results  Component Value Date   WBC 4.8 10/08/2020   HGB 14.1 10/08/2020   HCT 42.5 10/08/2020   PLT 196 10/08/2020   GLUCOSE 100 (H) 10/08/2020   CHOL 133 10/08/2020   TRIG 86 10/08/2020   HDL 52 10/08/2020   LDLCALC 64 10/08/2020   ALT 14 10/08/2020   AST 16 10/08/2020   NA 144 10/08/2020   K 4.0 10/08/2020   CL 105 10/08/2020   CREATININE 0.84 10/08/2020   BUN 13 10/08/2020   CO2 24 10/08/2020   TSH 2.980 10/08/2020   HGBA1C 6.2 (H) 10/08/2020   MICROALBUR 30 06/03/2019      Assessment & Plan:  1. Gastroesophageal reflux disease with esophagitis, unspecified whether hemorrhage-not at goal - omeprazole (PRILOSEC) 20 MG capsule; Take 1 capsule (20 mg total) by mouth daily.  Dispense: 90 capsule; Refill: 3 -avoid foods that trigger GERD  2. Mixed hyperlipidemia-well controlled - Comprehensive metabolic panel - Lipid panel -continue Simvastatin as prescribed  3. Prediabetes-well controlled - CBC with Differential/Platelet - Hemoglobin A1c  4. Age-related osteoporosis without current pathological fracture - DG Bone Density; Future - Vitamin D, 25-hydroxy  -continue Calcium with Vitamin D supplemenet   Continue medications Begin  Omeprazole 20 mg daily before breakfast for heartburn We will call you with lab results and osteoporosis scan Follow-up in 23-months       Follow-up: 44-months, fasting  An After Visit Summary was printed and given to the patient.   I, Rip Harbour, NP, have reviewed all documentation for this visit. The documentation on 02/11/21 for the exam, diagnosis, procedures, and orders are all accurate and complete.    Signed, Rip Harbour, NP [place] [phone]

## 2021-02-12 LAB — CBC WITH DIFFERENTIAL/PLATELET
Basophils Absolute: 0 10*3/uL (ref 0.0–0.2)
Basos: 1 %
EOS (ABSOLUTE): 0.1 10*3/uL (ref 0.0–0.4)
Eos: 2 %
Hematocrit: 43.9 % (ref 34.0–46.6)
Hemoglobin: 14.8 g/dL (ref 11.1–15.9)
Immature Grans (Abs): 0 10*3/uL (ref 0.0–0.1)
Immature Granulocytes: 0 %
Lymphocytes Absolute: 1.9 10*3/uL (ref 0.7–3.1)
Lymphs: 35 %
MCH: 31.3 pg (ref 26.6–33.0)
MCHC: 33.7 g/dL (ref 31.5–35.7)
MCV: 93 fL (ref 79–97)
Monocytes Absolute: 0.4 10*3/uL (ref 0.1–0.9)
Monocytes: 7 %
Neutrophils Absolute: 2.9 10*3/uL (ref 1.4–7.0)
Neutrophils: 55 %
Platelets: 210 10*3/uL (ref 150–450)
RBC: 4.73 x10E6/uL (ref 3.77–5.28)
RDW: 12.7 % (ref 11.7–15.4)
WBC: 5.3 10*3/uL (ref 3.4–10.8)

## 2021-02-12 LAB — LIPID PANEL
Chol/HDL Ratio: 2.7 ratio (ref 0.0–4.4)
Cholesterol, Total: 168 mg/dL (ref 100–199)
HDL: 63 mg/dL (ref >39)
LDL Chol Calc (NIH): 90 mg/dL (ref 0–99)
Triglycerides: 80 mg/dL (ref 0–149)
VLDL Cholesterol Cal: 15 mg/dL (ref 5–40)

## 2021-02-12 LAB — HEMOGLOBIN A1C
Est. average glucose Bld gHb Est-mCnc: 128 mg/dL
Hgb A1c MFr Bld: 6.1 % — ABNORMAL HIGH (ref 4.8–5.6)

## 2021-02-12 LAB — COMPREHENSIVE METABOLIC PANEL
ALT: 23 IU/L (ref 0–32)
AST: 18 IU/L (ref 0–40)
Albumin/Globulin Ratio: 2 (ref 1.2–2.2)
Albumin: 4.7 g/dL (ref 3.7–4.7)
Alkaline Phosphatase: 61 IU/L (ref 44–121)
BUN/Creatinine Ratio: 13 (ref 12–28)
BUN: 11 mg/dL (ref 8–27)
Bilirubin Total: 0.6 mg/dL (ref 0.0–1.2)
CO2: 27 mmol/L (ref 20–29)
Calcium: 9.6 mg/dL (ref 8.7–10.3)
Chloride: 104 mmol/L (ref 96–106)
Creatinine, Ser: 0.84 mg/dL (ref 0.57–1.00)
Globulin, Total: 2.3 g/dL (ref 1.5–4.5)
Glucose: 104 mg/dL — ABNORMAL HIGH (ref 70–99)
Potassium: 4.5 mmol/L (ref 3.5–5.2)
Sodium: 145 mmol/L — ABNORMAL HIGH (ref 134–144)
Total Protein: 7 g/dL (ref 6.0–8.5)
eGFR: 72 mL/min/{1.73_m2} (ref >59)

## 2021-02-12 LAB — CARDIOVASCULAR RISK ASSESSMENT

## 2021-02-12 LAB — VITAMIN D 25 HYDROXY (VIT D DEFICIENCY, FRACTURES): Vit D, 25-Hydroxy: 55.6 ng/mL (ref 30.0–100.0)

## 2021-02-14 DIAGNOSIS — Z01419 Encounter for gynecological examination (general) (routine) without abnormal findings: Secondary | ICD-10-CM | POA: Diagnosis not present

## 2021-02-14 DIAGNOSIS — Z6824 Body mass index (BMI) 24.0-24.9, adult: Secondary | ICD-10-CM | POA: Diagnosis not present

## 2021-02-16 ENCOUNTER — Telehealth: Payer: Self-pay | Admitting: Legal Medicine

## 2021-02-16 NOTE — Telephone Encounter (Signed)
° °  Kelly Horn has been scheduled for the following appointment:  WHAT: BONE DENSITY WHERE: [facility] DATE: 02/24/21 TIME: 12:30 PM ARRIVAL TIME  Patient has been made aware.

## 2021-02-24 DIAGNOSIS — M810 Age-related osteoporosis without current pathological fracture: Secondary | ICD-10-CM | POA: Diagnosis not present

## 2021-02-24 DIAGNOSIS — M85851 Other specified disorders of bone density and structure, right thigh: Secondary | ICD-10-CM | POA: Diagnosis not present

## 2021-03-17 ENCOUNTER — Other Ambulatory Visit: Payer: Self-pay

## 2021-03-17 DIAGNOSIS — M810 Age-related osteoporosis without current pathological fracture: Secondary | ICD-10-CM

## 2021-06-03 NOTE — Progress Notes (Signed)
Subjective:  Patient ID: Kelly Horn, female    DOB: 1945/04/06  Age: 76 y.o. MRN: [id number]  Chief Complaint  Patient presents with   Hypertension   Hyperlipidemia   Hypothyroidism   Prediabetes    HPI Kelly Horn is a 26-year -old Caucasian female that presents for follow-up of hyperlipidemia, prediabetes, and GERD. She has experienced increased stress due to spouse's chronic back pain. He has upcoming appt this week to see if a surgical fixation can provide relief of pain. Colonoscopy with Dr Melina Copa 05/04/17 revealed two small polyps and diverticula.  DEXA scan revealed osteopenia. Pt is taking Calcium and Vit  D supplement.  Recently eye exam, had bilateral cataract surgery Nov 2022 with Dr Salomon Fick at [facility].      Lipid/Cholesterol, Follow-up  Last lipid panel Other pertinent labs  Lab Results  Component Value Date   CHOL 168 02/11/2021   HDL 63 02/11/2021   LDLCALC 90 02/11/2021   TRIG 80 02/11/2021   CHOLHDL 2.7 02/11/2021   Lab Results  Component Value Date   ALT 23 02/11/2021   AST 18 02/11/2021   PLT 210 02/11/2021   TSH 2.980 10/08/2020     She was last seen for this 5 months ago.  Management since that visit includes simvastatin.     She reports excellent compliance with treatment. She is not having side effects.      Current diet: in general, a "healthy" diet   Current exercise: housecleaning  Prediabetes, Follow-up  Lab Results  Component Value Date   HGBA1C 6.1 (H) 02/11/2021   HGBA1C 6.2 (H) 10/08/2020   HGBA1C 6.1 (H) 06/02/2020   GLUCOSE 104 (H) 02/11/2021   GLUCOSE 100 (H) 10/08/2020   GLUCOSE 107 (H) 06/02/2020    Management since that visit includes diet and exercise. Current symptoms include none and have been stable.    GERD, Follow up: Current treatment consist of: Omeprazole           She reports excellent compliance with treatment. She is not having side effects. . She IS experiencing heartburn. She is NOT  experiencing difficulty swallowing       Current Outpatient Medications on File Prior to Visit  Medication Sig Dispense Refill   Calcium Carbonate-Vit D-Min (CALCIUM 1200 PO) Take 1,200 mg by mouth daily.     Investigational vitamin D 600 UNITS capsule SWOG S0812 Take 600 Units by mouth daily. Take with food.     levothyroxine (SYNTHROID) 50 MCG tablet TAKE 1 TABLET BY MOUTH DAILY 90 tablet 2   omeprazole (PRILOSEC) 20 MG capsule Take 1 capsule (20 mg total) by mouth daily. 90 capsule 3   simvastatin (ZOCOR) 20 MG tablet TAKE 1 TABLET BY MOUTH DAILY 90 tablet 2   No current facility-administered medications on file prior to visit.   Past Medical History:  Diagnosis Date   Age-related osteoporosis without current pathological fracture    GERD with esophagitis    Mixed hyperlipidemia    Other specified hypothyroidism    Type 2 diabetes mellitus with other specified complication [facility])    Past Surgical History:  Procedure Laterality Date   CERVICAL DISC SURGERY     PARTIAL HYSTERECTOMY      Family History  Problem Relation Age of Onset   Cancer Mother    Heart attack Father    Diabetes Other    Arthritis Other    Social History   Socioeconomic History   Marital status: Married  Spouse name: Not on file   Number of children: 2   Years of education: Not on file   Highest education level: Not on file  Occupational History   Occupation: retired  Tobacco Use   Smoking status: Never   Smokeless tobacco: Never  Vaping Use   Vaping Use: Never used  Substance and Sexual Activity   Alcohol use: Not on file   Drug use: Not on file   Sexual activity: Not on file  Other Topics Concern   Not on file  Social History Narrative   Not on file   Social Determinants of Health   Financial Resource Strain: Not on file  Food Insecurity: Not on file  Transportation Needs: Not on file  Physical Activity: Not on file  Stress: Not on file  Social Connections: Not on file     Review of Systems  Constitutional:  Positive for fatigue. Negative for chills and fever.  HENT:  Negative for congestion, ear pain, rhinorrhea and sore throat.   Respiratory:  Positive for cough. Negative for shortness of breath.   Cardiovascular:  Negative for chest pain.  Gastrointestinal:  Positive for constipation. Negative for abdominal pain, diarrhea, nausea and vomiting.  Genitourinary:  Negative for dysuria and urgency.  Musculoskeletal:  Negative for back pain and myalgias.  Neurological:  Negative for dizziness, weakness, light-headedness and headaches.  Psychiatric/Behavioral:  Positive for dysphoric mood. The patient is not nervous/anxious.     Objective:  BP 116/72   Pulse 68   Temp (!) 96.6 F (35.9 C)   Resp 16   Ht 5' 1.5" (1.562 m)   Wt 131 lb 14.4 oz (59.8 kg)   BMI 24.52 kg/m       02/11/2021    8:12 AM 11/15/2020   11:02 AM 10/08/2020    7:46 AM  BP/Weight  Systolic BP 124 580 998  Diastolic BP 70 60 60  Wt. (Lbs) 130 131 130  BMI 24.56 kg/m2 24.35 kg/m2 24.17 kg/m2    Physical Exam Vitals reviewed.  Constitutional:      Appearance: Normal appearance.  HENT:     Head: Normocephalic.     Right Ear: Tympanic membrane normal.     Left Ear: Tympanic membrane normal.     Nose: Nose normal.     Mouth/Throat:     Mouth: Mucous membranes are moist.  Eyes:     Pupils: Pupils are equal, round, and reactive to light.  Cardiovascular:     Rate and Rhythm: Normal rate and regular rhythm.     Pulses: Normal pulses.     Heart sounds: Normal heart sounds.  Pulmonary:     Effort: Pulmonary effort is normal.     Breath sounds: Normal breath sounds.  Abdominal:     General: Bowel sounds are normal.     Palpations: Abdomen is soft.  Musculoskeletal:        General: Normal range of motion.  Skin:    General: Skin is warm and dry.     Capillary Refill: Capillary refill takes less than 2 seconds.  Neurological:     General: No focal deficit present.      Mental Status: She is alert and oriented to person, place, and time.  Psychiatric:        Mood and Affect: Mood normal.        Behavior: Behavior normal.    Lab Results  Component Value Date   WBC 5.3 02/11/2021   HGB 14.8 02/11/2021  HCT 43.9 02/11/2021   PLT 210 02/11/2021   GLUCOSE 104 (H) 02/11/2021   CHOL 168 02/11/2021   TRIG 80 02/11/2021   HDL 63 02/11/2021   LDLCALC 90 02/11/2021   ALT 23 02/11/2021   AST 18 02/11/2021   NA 145 (H) 02/11/2021   K 4.5 02/11/2021   CL 104 02/11/2021   CREATININE 0.84 02/11/2021   BUN 11 02/11/2021   CO2 27 02/11/2021   TSH 2.980 10/08/2020   HGBA1C 6.1 (H) 02/11/2021   MICROALBUR 30 06/03/2019      Assessment & Plan:   1. Gastroesophageal reflux disease with esophagitis, unspecified whether hemorrhage-well controlled - CBC with Differential/Platelet - Comprehensive metabolic panel  2. Primary hypothyroidism-well controlled - TSH -continue Levothyroxine 50 mcg QD  3. Mixed hyperlipidemia-well controlled - Comprehensive metabolic panel - Lipid panel -continue Simvastatin  4. Prediabetes-well controlled - CBC with Differential/Platelet - Hemoglobin A1c  5. Osteopenia, unspecified location - Vitamin D, 25-hydroxy -continue Vit D and calcium supplement    We will call you with lab results  Continue medications Follow-up in 12-month, fasting  Follow-up: 673-month fasting  An After Visit Summary was printed and given to the patient.  I, ShRip HarbourNP, have reviewed all documentation for this visit. The documentation on 06/06/21 for the exam, diagnosis, procedures, and orders are all accurate and complete.    Signed, ShRip HarbourNP [phone]

## 2021-06-06 ENCOUNTER — Encounter: Payer: Self-pay | Admitting: Nurse Practitioner

## 2021-06-06 ENCOUNTER — Ambulatory Visit (INDEPENDENT_AMBULATORY_CARE_PROVIDER_SITE_OTHER): Payer: PPO | Admitting: Nurse Practitioner

## 2021-06-06 VITALS — BP 116/72 | HR 68 | Temp 96.6°F | Resp 16 | Ht 61.5 in | Wt 131.9 lb

## 2021-06-06 DIAGNOSIS — E782 Mixed hyperlipidemia: Secondary | ICD-10-CM | POA: Diagnosis not present

## 2021-06-06 DIAGNOSIS — E039 Hypothyroidism, unspecified: Secondary | ICD-10-CM | POA: Diagnosis not present

## 2021-06-06 DIAGNOSIS — K2100 Gastro-esophageal reflux disease with esophagitis, without bleeding: Secondary | ICD-10-CM | POA: Diagnosis not present

## 2021-06-06 DIAGNOSIS — M8580 Other specified disorders of bone density and structure, unspecified site: Secondary | ICD-10-CM

## 2021-06-06 DIAGNOSIS — R7303 Prediabetes: Secondary | ICD-10-CM | POA: Diagnosis not present

## 2021-06-06 NOTE — Patient Instructions (Signed)
We will call you with lab results  Continue medications Follow-up in 17-month  Osteopenia  Osteopenia is a loss of thickness (density) inside the bones. Another name for osteopenia is low bone mass. Mild osteopenia is a normal part of aging. It is not a disease, and it does not cause symptoms. However, if you have osteopenia and continue to lose bone mass, you could develop a condition that causes the bones to become thin and break more easily (osteoporosis). Osteoporosis can cause you to lose some height, have back pain, and have a stooped posture. Although osteopenia is not a disease, making changes to your lifestyle and diet can help to prevent osteopenia from developing into osteoporosis. What are the causes? Osteopenia is caused by loss of calcium in the bones. Bones are constantly changing. Old bone cells are continually being replaced with new bone cells. This process builds new bone. The mineral calcium is needed to build new bone and maintain bone density. Bone density is usually highest around age 77 After that, most people's bodies cannot replace all the bone they have lost with new bone. What increases the risk? You are more likely to develop this condition if: You are older than age 77 You are a woman who went through menopause early. You have a long illness that keeps you in bed. You do not get enough exercise. You lack certain nutrients (malnutrition). You have an overactive thyroid gland (hyperthyroidism). You use products that contain nicotine or tobacco, such as cigarettes, e-cigarettes and chewing tobacco, or you drink a lot of alcohol. You are taking medicines that weaken the bones, such as steroids. What are the signs or symptoms? This condition does not cause any symptoms. You may have a slightly higher risk for bone breaks (fractures), so getting fractures more easily than normal may be an indication of osteopenia. How is this diagnosed? This condition may be  diagnosed based on an X-ray exam that measures bone density (dual-energy X-ray absorptiometry, or DEXA). This test can measure bone density in your hips, spine, and wrists. Osteopenia has no symptoms, so this condition is usually diagnosed after a routine bone density screening test is done for osteoporosis. This routine screening is usually done for: Women who are age 3580or older. Men who are age 7057or older. If you have risk factors for osteopenia, you may have the screening test at an earlier age. How is this treated? Making dietary and lifestyle changes can lower your risk for osteoporosis. If you have severe osteopenia that is close to becoming osteoporosis, this condition can be treated with medicines and dietary supplements such as calcium and vitamin D. These supplements help to rebuild bone density. Follow these instructions at home: Eating and drinking Eat a diet that is high in calcium and vitamin D. Calcium is found in dairy products, beans, salmon, and leafy green vegetables like spinach and broccoli. Look for foods that have vitamin D and calcium added to them (fortified foods), such as orange juice, cereal, and bread.  Lifestyle Do 30 minutes or more of a weight-bearing exercise every day, such as walking, jogging, or playing a sport. These types of exercises strengthen the bones. Do not use any products that contain nicotine or tobacco, such as cigarettes, e-cigarettes, and chewing tobacco. If you need help quitting, ask your health care provider. Do not drink alcohol if: Your health care provider tells you not to drink. You are pregnant, may be pregnant, or are planning to become pregnant. If you drink  alcohol: Limit how much you use to: 0-1 drink a day for women. 0-2 drinks a day for men. Be aware of how much alcohol is in your drink. In the [place] one drink equals one 12 oz bottle of beer (355 mL), one 5 oz glass of wine (148 mL), or one 1 oz glass of hard liquor (44  mL). General instructions Take over-the-counter and prescription medicines only as told by your health care provider. These include vitamins and supplements. Take precautions at home to lower your risk of falling, such as: Keeping rooms well-lit and free of clutter, such as cords. Installing safety rails on stairs. Using rubber mats in the bathroom or other areas that are often wet or slippery. Keep all follow-up visits. This is important. Contact a health care provider if: You have not had a bone density screening for osteoporosis and you are: A woman who is age 32 or older. A man who is age 66 or older. You are a postmenopausal woman who has not had a bone density screening for osteoporosis. You are older than age 66 and you want to know if you should have bone density screening for osteoporosis. Summary Osteopenia is a loss of thickness (density) inside the bones. Another name for osteopenia is low bone mass. Osteopenia is not a disease, but it may increase your risk for a condition that causes the bones to become thin and break more easily (osteoporosis). You may be at risk for osteopenia if you are older than age 70 or if you are a woman who went through early menopause. Osteopenia does not cause any symptoms, but it can be diagnosed with a bone density screening test. Dietary and lifestyle changes are the first treatment for osteopenia. These may lower your risk for osteoporosis. This information is not intended to replace advice given to you by your health care provider. Make sure you discuss any questions you have with your health care provider. Document Revised: 06/19/2019 Document Reviewed: 06/19/2019 Elsevier Patient Education  [place] for Aging Adults Your bones do more than support your body. They also store calcium. The inside of your bones (marrow) makes blood cells. Maintaining bone health becomes more important as you age because your  bones replace all their cells about every 10 years. Around age 51, it gets harder to replace those cells, and bones can become weak. Weak bones can lead to osteoporosis and breaks (fractures). Falls also become more likely, which can cause fractures. The good news is that, with diet and exercise, you can improve and maintain your bone health at any age. How to eat for bone health A balanced diet can supply many of the vitamins, minerals, and proteins you need for bone health. Older adults need to make sure they get enough calcium, vitamin D, and magnesium. You may need more of these as you age. Calcium  Calcium is the most important (essential) mineral for bone health. The daily requirement for calcium for adult men aged 60-70 years is 1,000 mg. For adult women aged 69-70 years, it is 1,200 mg. At age 19 or older, it is 1,200 mg for both men and women. Sources of calcium in your diet include: Dairy foods like milk, yogurt, and cheese. Dairy foods are the best sources. If you cannot eat dairy, you may need a calcium supplement. Leafy, dark green vegetables. These include collard greens, kale, broccoli, bok choy, and okra. Fatty fish, like sardines and canned salmon with bones.  Almonds. Tofu.  Vitamin D You need vitamin D for bone health because it helps your body absorb calcium from your diet. It is not found naturally in many foods. Many people can benefit from taking a supplement. The daily requirement for vitamin D at age 24 or older is 600-1,000 international units (IU). Dietary sources for vitamin D include: Fatty fish, such as swordfish, salmon, sardine, and mackerel. Foods that have vitamin D added to them (are fortified), like cereal and dairy products. Egg yolks. Magnesium Magnesium helps your body use both calcium and vitamin D. The recommended daily intake for adult men is 400-420 mg. For adult women, it is 310-320 mg. Dietary sources of magnesium include: Green vegetables, such as  collard greens, kale, bok choy, and okra. Poppy, sesame, and chia seeds. Legumes, including peas and beans. Whole grains. Avocados. Nuts. If you drink alcohol regularly or take a type of antacid called a proton pump inhibitor, you might benefit from a magnesium supplement. How to exercise for bone health Exercise is important for bone health because it strengthens bones and muscles. Bones are living organs that get stronger when you exercise them, just like your heart and other muscles. Muscles support your bones and protect your joints. Strong muscles also help prevent bone loss and falls. Weight-bearing exercises and resistance exercises are the two types of exercise that are most important for bone health. Weight-bearing exercises include running or walking, climbing stairs, and playing sports like tennis. Resistance exercises include those done using free weights, weight machines, or resistance bands. Try to get at least 30 minutes of exercise every day. You can also include stretching, balance, and flexibility exercises, like yoga or tai chi. These lower your risk of falls. Follow these instructions at home Ask your health care provider if: You need a bone density test. This is especially important for women older than 12 and men older than 70. You have been losing height. Loss of height may be a sign of weakening bones in your spine. Any of your medicines or medical conditions could affect your bone health. He or she could recommend an exercise program that is safe for you. Be sure it includes both weight-bearing and resistance exercises. Do not use any products that contain nicotine or tobacco. These products include cigarettes, chewing tobacco, and vaping devices, such as e-cigarettes. These can reduce bone density. If you need help quitting, ask your health care provider. Drink alcohol in moderation. Alcohol reduces bone density and increases your risk for falls. If you drink  alcohol: Limit how much you have to: 0-1 drink a day for women who are not pregnant. 0-2 drinks a day for men. Know how much alcohol is in a drink. In the [place] one drink equals one 12 oz bottle of beer (355 mL), one 5 oz glass of wine (148 mL), or one 1 oz glass of hard liquor (44 mL). Take over-the-counter and prescription medicines only as told by your health care provider. Ask your health care provider if you could benefit from taking calcium, vitamin D, or magnesium supplements. Keep all follow-up visits. This is important. Where to find more information American Bone Health: CardKnowledge.fi American Academy of Orthopaedic Surgeons: orthoinfo.[place]: bones.SouthExposed.es Summary Maintaining your bone health becomes more important as you age. You can improve and maintain your bone health with diet and exercise at any age. A balanced diet can supply many of the vitamins, minerals, and proteins you need for bone health. Older adults need  to make sure they get enough calcium, vitamin D, and magnesium. Ask your health care provider if you could benefit from taking calcium, vitamin D, or magnesium supplements. Exercise is important for bone health because it strengthens bones and muscles. Do both weight-bearing and resistance exercises. This information is not intended to replace advice given to you by your health care provider. Make sure you discuss any questions you have with your health care provider. Document Revised: 06/16/2020 Document Reviewed: 06/16/2020 Elsevier Patient Education  [place].

## 2021-06-07 LAB — CBC WITH DIFFERENTIAL/PLATELET
Basophils Absolute: 0 10*3/uL (ref 0.0–0.2)
Basos: 0 %
EOS (ABSOLUTE): 0.2 10*3/uL (ref 0.0–0.4)
Eos: 5 %
Hematocrit: 41.2 % (ref 34.0–46.6)
Hemoglobin: 13.9 g/dL (ref 11.1–15.9)
Immature Grans (Abs): 0 10*3/uL (ref 0.0–0.1)
Immature Granulocytes: 0 %
Lymphocytes Absolute: 1.9 10*3/uL (ref 0.7–3.1)
Lymphs: 42 %
MCH: 31 pg (ref 26.6–33.0)
MCHC: 33.7 g/dL (ref 31.5–35.7)
MCV: 92 fL (ref 79–97)
Monocytes Absolute: 0.4 10*3/uL (ref 0.1–0.9)
Monocytes: 8 %
Neutrophils Absolute: 2 10*3/uL (ref 1.4–7.0)
Neutrophils: 45 %
Platelets: 217 10*3/uL (ref 150–450)
RBC: 4.49 x10E6/uL (ref 3.77–5.28)
RDW: 12.3 % (ref 11.7–15.4)
WBC: 4.5 10*3/uL (ref 3.4–10.8)

## 2021-06-07 LAB — COMPREHENSIVE METABOLIC PANEL
ALT: 18 IU/L (ref 0–32)
AST: 22 IU/L (ref 0–40)
Albumin/Globulin Ratio: 1.9 (ref 1.2–2.2)
Albumin: 4.3 g/dL (ref 3.7–4.7)
Alkaline Phosphatase: 58 IU/L (ref 44–121)
BUN/Creatinine Ratio: 11 — ABNORMAL LOW (ref 12–28)
BUN: 9 mg/dL (ref 8–27)
Bilirubin Total: 0.4 mg/dL (ref 0.0–1.2)
CO2: 25 mmol/L (ref 20–29)
Calcium: 9.1 mg/dL (ref 8.7–10.3)
Chloride: 107 mmol/L — ABNORMAL HIGH (ref 96–106)
Creatinine, Ser: 0.82 mg/dL (ref 0.57–1.00)
Globulin, Total: 2.3 g/dL (ref 1.5–4.5)
Glucose: 105 mg/dL — ABNORMAL HIGH (ref 70–99)
Potassium: 4.5 mmol/L (ref 3.5–5.2)
Sodium: 144 mmol/L (ref 134–144)
Total Protein: 6.6 g/dL (ref 6.0–8.5)
eGFR: 74 mL/min/{1.73_m2} (ref >59)

## 2021-06-07 LAB — LIPID PANEL
Chol/HDL Ratio: 2.5 ratio (ref 0.0–4.4)
Cholesterol, Total: 147 mg/dL (ref 100–199)
HDL: 60 mg/dL (ref >39)
LDL Chol Calc (NIH): 69 mg/dL (ref 0–99)
Triglycerides: 99 mg/dL (ref 0–149)
VLDL Cholesterol Cal: 18 mg/dL (ref 5–40)

## 2021-06-07 LAB — CARDIOVASCULAR RISK ASSESSMENT

## 2021-06-07 LAB — TSH: TSH: 4.38 u[IU]/mL (ref 0.450–4.500)

## 2021-06-07 LAB — HEMOGLOBIN A1C
Est. average glucose Bld gHb Est-mCnc: 128 mg/dL
Hgb A1c MFr Bld: 6.1 % — ABNORMAL HIGH (ref 4.8–5.6)

## 2021-06-07 LAB — VITAMIN D 25 HYDROXY (VIT D DEFICIENCY, FRACTURES): Vit D, 25-Hydroxy: 56.3 ng/mL (ref 30.0–100.0)

## 2021-06-27 DIAGNOSIS — C4442 Squamous cell carcinoma of skin of scalp and neck: Secondary | ICD-10-CM | POA: Diagnosis not present

## 2021-06-27 DIAGNOSIS — L578 Other skin changes due to chronic exposure to nonionizing radiation: Secondary | ICD-10-CM | POA: Diagnosis not present

## 2021-06-27 DIAGNOSIS — L570 Actinic keratosis: Secondary | ICD-10-CM | POA: Diagnosis not present

## 2021-08-02 ENCOUNTER — Other Ambulatory Visit: Payer: Self-pay | Admitting: Legal Medicine

## 2021-08-02 DIAGNOSIS — E782 Mixed hyperlipidemia: Secondary | ICD-10-CM

## 2021-10-25 ENCOUNTER — Other Ambulatory Visit: Payer: Self-pay | Admitting: Legal Medicine

## 2021-10-25 DIAGNOSIS — E038 Other specified hypothyroidism: Secondary | ICD-10-CM

## 2021-10-28 ENCOUNTER — Encounter: Payer: Self-pay | Admitting: Nurse Practitioner

## 2021-10-28 ENCOUNTER — Ambulatory Visit (INDEPENDENT_AMBULATORY_CARE_PROVIDER_SITE_OTHER): Payer: PPO | Admitting: Nurse Practitioner

## 2021-10-28 VITALS — BP 110/80 | HR 78 | Temp 97.6°F | Ht 61.0 in | Wt 138.0 lb

## 2021-10-28 DIAGNOSIS — J0181 Other acute recurrent sinusitis: Secondary | ICD-10-CM | POA: Diagnosis not present

## 2021-10-28 MED ORDER — FLUTICASONE PROPIONATE 50 MCG/ACT NA SUSP: 2.0000 | Freq: Every day | NASAL | 6 refills | Status: DC

## 2021-10-28 MED ORDER — AZITHROMYCIN 250 MG PO TABS
ORAL_TABLET | ORAL | 0 refills | Status: AC
Start: 2021-10-28 — End: 2021-11-02

## 2021-10-28 NOTE — Patient Instructions (Addendum)
Flonase nasal spray Take Zpack as directed Continue Cetrizine 10 mg daily Synthroid 50 mcg daily Follow-up as needed     Sinus Infection, Adult A sinus infection is soreness and swelling (inflammation) of your sinuses. Sinuses are hollow spaces in the bones around your face. They are located: Around your eyes. In the middle of your forehead. Behind your nose. In your cheekbones. Your sinuses and nasal passages are lined with a fluid called mucus. Mucus drains out of your sinuses. Swelling can trap mucus in your sinuses. This lets germs (bacteria, virus, or fungus) grow, which leads to infection. Most of the time, this condition is caused by a virus. What are the causes? Allergies. Asthma. Germs. Things that block your nose or sinuses. Growths in the nose (nasal polyps). Chemicals or irritants in the air. A fungus. This is rare. What increases the risk? Having a weak body defense system (immune system). Doing a lot of swimming or diving. Using nasal sprays too much. Smoking. What are the signs or symptoms? The main symptoms of this condition are pain and a feeling of pressure around the sinuses. Other symptoms include: Stuffy nose (congestion). This may make it hard to breathe through your nose. Runny nose (drainage). Soreness, swelling, and warmth in the sinuses. A cough that may get worse at night. Being unable to smell and taste. Mucus that collects in the throat or the back of the nose (postnasal drip). This may cause a sore throat or bad breath. Being very tired (fatigued). A fever. How is this diagnosed? Your symptoms. Your medical history. A physical exam. Tests to find out if your condition is short-term (acute) or long-term (chronic). Your doctor may: Check your nose for growths (polyps). Check your sinuses using a tool that has a light on one end (endoscope). Check for allergies or germs. Do imaging tests, such as an MRI or CT scan. How is this  treated? Treatment for this condition depends on the cause and whether it is short-term or long-term. If caused by a virus, your symptoms should go away on their own within 10 days. You may be given medicines to relieve symptoms. They include: Medicines that shrink swollen tissue in the nose. A spray that treats swelling of the nostrils. Rinses that help get rid of thick mucus in your nose (nasal saline washes). Medicines that treat allergies (antihistamines). Over-the-counter pain relievers. If caused by bacteria, your doctor may wait to see if you will get better without treatment. You may be given antibiotic medicine if you have: A very bad infection. A weak body defense system. If caused by growths in the nose, surgery may be needed. Follow these instructions at home: Medicines Take, use, or apply over-the-counter and prescription medicines only as told by your doctor. These may include nasal sprays. If you were prescribed an antibiotic medicine, take it as told by your doctor. Do not stop taking it even if you start to feel better. Hydrate and humidify  Drink enough water to keep your pee (urine) pale yellow. Use a cool mist humidifier to keep the humidity level in your home above 50%. Breathe in steam for 10-15 minutes, 3-4 times a day, or as told by your doctor. You can do this in the bathroom while a hot shower is running. Try not to spend time in cool or dry air. Rest Rest as much as you can. Sleep with your head raised (elevated). Make sure you get enough sleep each night. General instructions  Put a warm, moist  washcloth on your face 3-4 times a day, or as often as told by your doctor. Use nasal saline washes as often as told by your doctor. Wash your hands often with soap and water. If you cannot use soap and water, use hand sanitizer. Do not smoke. Avoid being around people who are smoking (secondhand smoke). Keep all follow-up visits. Contact a doctor if: You have a  fever. Your symptoms get worse. Your symptoms do not get better within 10 days. Get help right away if: You have a very bad headache. You cannot stop vomiting. You have very bad pain or swelling around your face or eyes. You have trouble seeing. You feel confused. Your neck is stiff. You have trouble breathing. These symptoms may be an emergency. Get help right away. Call 911. Do not wait to see if the symptoms will go away. Do not drive yourself to the hospital. Summary A sinus infection is swelling of your sinuses. Sinuses are hollow spaces in the bones around your face. This condition is caused by tissues in your nose that become inflamed or swollen. This traps germs. These can lead to infection. If you were prescribed an antibiotic medicine, take it as told by your doctor. Do not stop taking it even if you start to feel better. Keep all follow-up visits. This information is not intended to replace advice given to you by your health care provider. Make sure you discuss any questions you have with your health care provider. Document Revised: 12/07/2020 Document Reviewed: 12/07/2020 Elsevier Patient Education  [place].

## 2021-10-28 NOTE — Progress Notes (Signed)
Acute Office Visit  Subjective:    Patient ID: Kelly Horn, female    DOB: 1945-04-29, 76 y.o.   MRN: [id number]  Chief Complaint  Patient presents with   URI    HPI: Patient is in today for Upper respiratory symptoms She complains of congestion, no  fever, non productive cough, post nasal drip, sneezing, and hoarse voice. Denies fever, chills, or body aches. Onset of symptoms was  2-3 weeks  and staying constant. Treatment has included Tylenol and Zyrtec.  Past history is significant for no history of pneumonia or bronchitis. Patient is non-smoker.    Past Medical History:  Diagnosis Date   Age-related osteoporosis without current pathological fracture    GERD with esophagitis    Mixed hyperlipidemia    Other specified hypothyroidism    Type 2 diabetes mellitus with other specified complication ([place])     Past Surgical History:  Procedure Laterality Date   CERVICAL DISC SURGERY     PARTIAL HYSTERECTOMY      Family History  Problem Relation Age of Onset   Cancer Mother    Heart attack Father    Diabetes Other    Arthritis Other     Social History   Socioeconomic History   Marital status: Married    Spouse name: Not on file   Number of children: 2   Years of education: Not on file   Highest education level: Not on file  Occupational History   Occupation: retired  Tobacco Use   Smoking status: Never   Smokeless tobacco: Never  Vaping Use   Vaping Use: Never used  Substance and Sexual Activity   Alcohol use: Not on file   Drug use: Not on file   Sexual activity: Not on file  Other Topics Concern   Not on file  Social History Narrative   Not on file   Social Determinants of Health   Financial Resource Strain: Low Risk  (06/06/2021)   Overall Financial Resource Strain (CARDIA)    Difficulty of Paying Living Expenses: Not hard at all  Food Insecurity: No Food Insecurity (06/06/2021)   Hunger Vital Sign    Worried About Running Out of Food in the Last  Year: Never true    [place] in the Last Year: Never true  Transportation Needs: No Transportation Needs (06/06/2021)   PRAPARE - Hydrologist (Medical): No    Lack of Transportation (Non-Medical): No  Physical Activity: Sufficiently Active (06/06/2021)   Exercise Vital Sign    Days of Exercise per Week: 5 days    Minutes of Exercise per Session: 30 min  Stress: No Stress Concern Present (06/06/2021)   [place]    Feeling of Stress : Not at all  Social Connections: [place] (06/06/2021)   Social Connection and Isolation Panel [NHANES]    Frequency of Communication with Friends and Family: Three times a week    Frequency of Social Gatherings with Friends and Family: Three times a week    Attends Religious Services: More than 4 times per year    Active Member of Clubs or Organizations: Yes    Attends Archivist Meetings: More than 4 times per year    Marital Status: Married  Human resources officer Violence: Not At Risk (06/06/2021)   Humiliation, Afraid, Rape, and Kick questionnaire    Fear of Current or Ex-Partner: No    Emotionally Abused: No  Physically Abused: No    Sexually Abused: No    Outpatient Medications Prior to Visit  Medication Sig Dispense Refill   Calcium Carbonate-Vit D-Min (CALCIUM 1200 PO) Take 1,200 mg by mouth daily.     cetirizine (ZYRTEC) 10 MG tablet Take 10 mg by mouth daily.     Investigational vitamin D 600 UNITS capsule SWOG S0812 Take 600 Units by mouth daily. Take with food.     levothyroxine (SYNTHROID) 50 MCG tablet TAKE 1 TABLET BY MOUTH DAILY 90 tablet 1   omeprazole (PRILOSEC) 20 MG capsule Take 1 capsule (20 mg total) by mouth daily. 90 capsule 3   simvastatin (ZOCOR) 20 MG tablet TAKE 1 TABLET BY MOUTH DAILY 90 tablet 1   No facility-administered medications prior to visit.    No Known Allergies  Review of Systems See  pertinent positives and negatives per HPI.     Objective:    Physical Exam Vitals reviewed.  Constitutional:      Appearance: Normal appearance. She is ill-appearing.  HENT:     Right Ear: Tympanic membrane normal.     Left Ear: Tympanic membrane normal.     Nose: Congestion and rhinorrhea present.     Mouth/Throat:     Pharynx: Posterior oropharyngeal erythema present.  Cardiovascular:     Rate and Rhythm: Normal rate and regular rhythm.     Pulses: Normal pulses.     Heart sounds: Normal heart sounds.  Pulmonary:     Effort: Pulmonary effort is normal.     Breath sounds: Normal breath sounds.  Abdominal:     General: Bowel sounds are normal.     Palpations: Abdomen is soft.  Neurological:     Mental Status: She is alert.     BP 110/80   Pulse 78   Temp 97.6 F (36.4 C)   Ht 5' 1"  (1.549 m)   Wt 138 lb (62.6 kg)   SpO2 98%   BMI 26.07 kg/m  Wt Readings from Last 3 Encounters:  10/28/21 138 lb (62.6 kg)  06/06/21 131 lb 14.4 oz (59.8 kg)  02/11/21 130 lb (59 kg)    Health Maintenance Due  Topic Date Due   Diabetic kidney evaluation - Urine ACR  11/21/2018   COVID-19 Vaccine (4 - Moderna series) 02/13/2020   FOOT EXAM  06/02/2021   OPHTHALMOLOGY EXAM  09/23/2021     Lab Results  Component Value Date   TSH 4.380 06/06/2021   Lab Results  Component Value Date   WBC 4.5 06/06/2021   HGB 13.9 06/06/2021   HCT 41.2 06/06/2021   MCV 92 06/06/2021   PLT 217 06/06/2021   Lab Results  Component Value Date   NA 144 06/06/2021   K 4.5 06/06/2021   CO2 25 06/06/2021   GLUCOSE 105 (H) 06/06/2021   BUN 9 06/06/2021   CREATININE 0.82 06/06/2021   BILITOT 0.4 06/06/2021   ALKPHOS 58 06/06/2021   AST 22 06/06/2021   ALT 18 06/06/2021   PROT 6.6 06/06/2021   ALBUMIN 4.3 06/06/2021   CALCIUM 9.1 06/06/2021   EGFR 74 06/06/2021   Lab Results  Component Value Date   CHOL 147 06/06/2021   Lab Results  Component Value Date   HDL 60 06/06/2021   Lab  Results  Component Value Date   LDLCALC 69 06/06/2021   Lab Results  Component Value Date   TRIG 99 06/06/2021   Lab Results  Component Value Date   CHOLHDL 2.5 06/06/2021  Lab Results  Component Value Date   HGBA1C 6.1 (H) 06/06/2021       Assessment & Plan:   1. Other acute recurrent sinusitis - fluticasone (FLONASE) 50 MCG/ACT nasal spray; Place 2 sprays into both nostrils daily.  Dispense: 16 g; Refill: 6 - azithromycin (ZITHROMAX) 250 MG tablet; Take 2 tablets on day 1, then 1 tablet daily on days 2 through 5  Dispense: 6 tablet; Refill: 0    Flonase nasal spray Take Zpack as directed Continue Cetrizine 10 mg daily Synthroid 50 mcg daily Follow-up as needed    Follow-up: PRN  An After Visit Summary was printed and given to the patient.  I, Rip Harbour, NP, have reviewed all documentation for this visit. The documentation on 10/28/21 for the exam, diagnosis, procedures, and orders are all accurate and complete.   Signed, Rip Harbour, NP [place] [phone]

## 2021-11-02 ENCOUNTER — Telehealth: Payer: Self-pay

## 2021-11-02 NOTE — Telephone Encounter (Signed)
Left message to call back to schedule AWV.

## 2021-11-21 DIAGNOSIS — H35371 Puckering of macula, right eye: Secondary | ICD-10-CM | POA: Diagnosis not present

## 2021-11-21 DIAGNOSIS — H33322 Round hole, left eye: Secondary | ICD-10-CM | POA: Diagnosis not present

## 2021-11-21 DIAGNOSIS — H43813 Vitreous degeneration, bilateral: Secondary | ICD-10-CM | POA: Diagnosis not present

## 2021-11-29 DIAGNOSIS — H33322 Round hole, left eye: Secondary | ICD-10-CM | POA: Diagnosis not present

## 2021-12-11 NOTE — Progress Notes (Signed)
Subjective:  Patient ID: Pura Spice, female    DOB: [birth year]  Age: 76 y.o. MRN: [id number]  CC: Prediabetes GERD Hypothyroidism   HPI  Pt presents for follow-up of prediabetes, hyperlipidemia,  and GERD.   Prediabetes, Follow-up  Lab Results  Component Value Date   HGBA1C 6.1 (H) 06/06/2021   HGBA1C 6.1 (H) 02/11/2021   HGBA1C 6.2 (H) 10/08/2020   GLUCOSE 105 (H) 06/06/2021   GLUCOSE 104 (H) 02/11/2021   GLUCOSE 100 (H) 10/08/2020    Last seen for for this6 months ago.  Management since that visit includes diet controlled. Current symptoms include none and have been stable.  Prior visit with dietician: no Current diet: in general, a "healthy" diet   Current exercise: walking  Pertinent Labs:    Component Value Date/Time   CHOL 147 06/06/2021 0814   TRIG 99 06/06/2021 0814   CHOLHDL 2.5 06/06/2021 0814   CREATININE 0.82 06/06/2021 0814    GERD, Follow up:  The patient was last seen for GERD 6 months ago. Current treatment consist CV:ELFYBOFB 20 mg QD She reports excellent compliance with treatment. She is not having side effects. Reports increased heartburn She is NOT experiencing choking on food   Hypothyroidism, follow-up: Pt has a history of hypothyroidism for several years. Current treatment includes Levothyroxine 50 mcg QD. Last TSH 4.38 on 06/06/21. Pt denies current symptoms of hypothyroidism.   Wt Readings from Last 3 Encounters:  10/28/21 138 lb (62.6 kg)  06/06/21 131 lb 14.4 oz (59.8 kg)  02/11/21 130 lb (59 kg)    Lipid/Cholesterol, Follow-up  Last lipid panel Other pertinent labs  Lab Results  Component Value Date   CHOL 147 06/06/2021   HDL 60 06/06/2021   LDLCALC 69 06/06/2021   TRIG 99 06/06/2021   CHOLHDL 2.5 06/06/2021   Lab Results  Component Value Date   ALT 18 06/06/2021   AST 22 06/06/2021   PLT 217 06/06/2021   TSH 4.380 06/06/2021     She was last seen for this 6 months ago.  Management includes Zocor 20 mg  QD. She reports excellent compliance with treatment. She is not having side effects.  Current diet: in general, a "healthy" diet   Current exercise: walking  The 10-year ASCVD risk score (Arnett DK, et al., 2019) is: 35.6%   Current Outpatient Medications on File Prior to Visit  Medication Sig Dispense Refill   Calcium Carbonate-Vit D-Min (CALCIUM 1200 PO) Take 1,200 mg by mouth daily.     cetirizine (ZYRTEC) 10 MG tablet Take 10 mg by mouth daily.     fluticasone (FLONASE) 50 MCG/ACT nasal spray Place 2 sprays into both nostrils daily. 16 g 6   Investigational vitamin D 600 UNITS capsule SWOG S0812 Take 600 Units by mouth daily. Take with food.     levothyroxine (SYNTHROID) 50 MCG tablet TAKE 1 TABLET BY MOUTH DAILY 90 tablet 1   omeprazole (PRILOSEC) 20 MG capsule Take 1 capsule (20 mg total) by mouth daily. 90 capsule 3   simvastatin (ZOCOR) 20 MG tablet TAKE 1 TABLET BY MOUTH DAILY 90 tablet 1   No current facility-administered medications on file prior to visit.   Past Medical History:  Diagnosis Date   Age-related osteoporosis without current pathological fracture    GERD with esophagitis    Mixed hyperlipidemia    Other specified hypothyroidism    Type 2 diabetes mellitus with other specified complication [facility])    Past Surgical History:  Procedure Laterality  Date   CERVICAL DISC SURGERY     PARTIAL HYSTERECTOMY      Family History  Problem Relation Age of Onset   Cancer Mother    Heart attack Father    Diabetes Other    Arthritis Other    Social History   Socioeconomic History   Marital status: Married    Spouse name: Not on file   Number of children: 2   Years of education: Not on file   Highest education level: Not on file  Occupational History   Occupation: retired  Tobacco Use   Smoking status: Never   Smokeless tobacco: Never  Vaping Use   Vaping Use: Never used  Substance and Sexual Activity   Alcohol use: Not on file   Drug use: Not on file    Sexual activity: Not on file  Other Topics Concern   Not on file  Social History Narrative   Not on file   Social Determinants of Health   Financial Resource Strain: [place]  (06/06/2021)   Overall Financial Resource Strain (CARDIA)    Difficulty of Paying Living Expenses: Not hard at all  Food Insecurity: No Food Insecurity (06/06/2021)   Hunger Vital Sign    Worried About Running Out of Food in the Last Year: Never true    [place] in the Last Year: Never true  Transportation Needs: No Transportation Needs (06/06/2021)   PRAPARE - Hydrologist (Medical): No    Lack of Transportation (Non-Medical): No  Physical Activity: Sufficiently Active (06/06/2021)   Exercise Vital Sign    Days of Exercise per Week: 5 days    Minutes of Exercise per Session: 30 min  Stress: No Stress Concern Present (06/06/2021)   [place]    Feeling of Stress : Not at all  Social Connections: [place] (06/06/2021)   Social Connection and Isolation Panel [NHANES]    Frequency of Communication with Friends and Family: Three times a week    Frequency of Social Gatherings with Friends and Family: Three times a week    Attends Religious Services: More than 4 times per year    Active Member of Clubs or Organizations: Yes    Attends Archivist Meetings: More than 4 times per year    Marital Status: Married    Review of Systems  Constitutional:  Negative for chills and fatigue.  HENT:  Negative for congestion, ear pain, sinus pain and sore throat.   Respiratory:  Negative for cough and shortness of breath.   Cardiovascular:  Negative for chest pain and leg swelling.  Gastrointestinal:  Negative for abdominal pain, constipation, diarrhea, nausea and vomiting.  Genitourinary:  Negative for dysuria and frequency.  Musculoskeletal:  Negative for arthralgias, back pain and myalgias.   Neurological:  Negative for dizziness and headaches.     Objective:  BP 130/62   Pulse 60   Temp (!) 97.4 F (36.3 C)   Ht '5\' 1"'$  (1.549 m)   Wt 133 lb 6.4 oz (60.5 kg)   SpO2 95%   BMI 25.21 kg/m       10/28/2021   10:34 AM 06/06/2021    7:31 AM 02/11/2021    8:12 AM  BP/Weight  Systolic BP 209 470 962  Diastolic BP 80 72 70  Wt. (Lbs) 138 131.9 130  BMI 26.07 kg/m2 24.52 kg/m2 24.56 kg/m2    Physical Exam Vitals reviewed.  Constitutional:      Appearance: Normal appearance.  HENT:     Right Ear: Tympanic membrane normal.     Left Ear: Tympanic membrane normal.     Nose: Nose normal.     Mouth/Throat:     Mouth: Mucous membranes are moist.  Eyes:     Pupils: Pupils are equal, round, and reactive to light.  Cardiovascular:     Rate and Rhythm: Normal rate and regular rhythm.     Pulses: Normal pulses.     Heart sounds: Normal heart sounds.  Pulmonary:     Effort: Pulmonary effort is normal.     Breath sounds: Normal breath sounds.  Abdominal:     General: Bowel sounds are normal.     Palpations: Abdomen is soft.  Skin:    General: Skin is warm and dry.     Capillary Refill: Capillary refill takes less than 2 seconds.  Neurological:     General: No focal deficit present.     Mental Status: She is alert and oriented to person, place, and time.  Psychiatric:        Mood and Affect: Mood normal.        Behavior: Behavior normal.         Lab Results  Component Value Date   WBC 4.5 06/06/2021   HGB 13.9 06/06/2021   HCT 41.2 06/06/2021   PLT 217 06/06/2021   GLUCOSE 105 (H) 06/06/2021   CHOL 147 06/06/2021   TRIG 99 06/06/2021   HDL 60 06/06/2021   LDLCALC 69 06/06/2021   ALT 18 06/06/2021   AST 22 06/06/2021   NA 144 06/06/2021   K 4.5 06/06/2021   CL 107 (H) 06/06/2021   CREATININE 0.82 06/06/2021   BUN 9 06/06/2021   CO2 25 06/06/2021   TSH 4.380 06/06/2021   HGBA1C 6.1 (H) 06/06/2021   MICROALBUR 30 06/03/2019      Assessment &  Plan:   1. Prediabetes-well controlled - Hemoglobin A1c - Comprehensive metabolic panel - CBC with Differential/Platelet -continue heart healthy low carb diet  2. Mixed hyperlipidemia - Lipid panel - Comprehensive metabolic panel - CBC with Differential/Platelet  3. Gastroesophageal reflux disease with esophagitis, unspecified whether hemorrhage - Comprehensive metabolic panel - CBC with Differential/Platelet - pantoprazole (PROTONIX) 40 MG tablet; Take 1 tablet (40 mg total) by mouth daily.  Dispense: 90 tablet; Refill: 3     We will call you with lab results Continue medications Begin Protonix 40 mg daily Avoid foods that trigger GERD Follow-up in 33-month, fasting  Follow-up: 650-month fasting  An After Visit Summary was printed and given to the patient.  I, ShRip HarbourNP, have reviewed all documentation for this visit. The documentation on 12/12/21 for the exam, diagnosis, procedures, and orders are all accurate and complete.    Signed, ShRip HarbourNP [phone]

## 2021-12-12 ENCOUNTER — Ambulatory Visit (INDEPENDENT_AMBULATORY_CARE_PROVIDER_SITE_OTHER): Payer: PPO | Admitting: Nurse Practitioner

## 2021-12-12 ENCOUNTER — Encounter: Payer: Self-pay | Admitting: Nurse Practitioner

## 2021-12-12 VITALS — BP 130/62 | HR 60 | Temp 97.4°F | Ht 61.0 in | Wt 133.4 lb

## 2021-12-12 DIAGNOSIS — E782 Mixed hyperlipidemia: Secondary | ICD-10-CM | POA: Diagnosis not present

## 2021-12-12 DIAGNOSIS — K2100 Gastro-esophageal reflux disease with esophagitis, without bleeding: Secondary | ICD-10-CM | POA: Diagnosis not present

## 2021-12-12 DIAGNOSIS — R7303 Prediabetes: Secondary | ICD-10-CM

## 2021-12-12 MED ORDER — PANTOPRAZOLE SODIUM 40 MG PO TBEC: 40.0000 mg | DELAYED_RELEASE_TABLET | Freq: Every day | ORAL | 3 refills | Status: DC

## 2021-12-12 NOTE — Patient Instructions (Addendum)
We will call you with lab results Continue medications Begin Protonix 40 mg daily Avoid foods that trigger GERD Follow-up in 12-month, fasting   Food Choices for Gastroesophageal Reflux Disease, Adult When you have gastroesophageal reflux disease (GERD), the foods you eat and your eating habits are very important. Choosing the right foods can help ease your discomfort. Think about working with a food expert (dietitian) to help you make good choices. What are tips for following this plan? Reading food labels Look for foods that are low in saturated fat. Foods that may help with your symptoms include: Foods that have less than 5% of daily value (DV) of fat. Foods that have 0 grams of trans fat. Cooking Do not fry your food. Cook your food by baking, steaming, grilling, or broiling. These are all methods that do not need a lot of fat for cooking. To add flavor, try to use herbs that are low in spice and acidity. Meal planning  Choose healthy foods that are low in fat, such as: Fruits and vegetables. Whole grains. Low-fat dairy products. Lean meats, fish, and poultry. Eat small meals often instead of eating 3 large meals each day. Eat your meals slowly in a place where you are relaxed. Avoid bending over or lying down until 2-3 hours after eating. Limit high-fat foods such as fatty meats or fried foods. Limit your intake of fatty foods, such as oils, butter, and shortening. Avoid the following as told by your doctor: Foods that cause symptoms. These may be different for different people. Keep a food diary to keep track of foods that cause symptoms. Alcohol. Drinking a lot of liquid with meals. Eating meals during the 2-3 hours before bed. Lifestyle Stay at a healthy weight. Ask your doctor what weight is healthy for you. If you need to lose weight, work with your doctor to do so safely. Exercise for at least 30 minutes on 5 or more days each week, or as told by your doctor. Wear  loose-fitting clothes. Do not smoke or use any products that contain nicotine or tobacco. If you need help quitting, ask your doctor. Sleep with the head of your bed higher than your feet. Use a wedge under the mattress or blocks under the bed frame to raise the head of the bed. Chew sugar-free gum after meals. What foods should eat?  Eat a healthy, well-balanced diet of fruits, vegetables, whole grains, low-fat dairy products, lean meats, fish, and poultry. Each person is different. Foods that may cause symptoms in one person may not cause any symptoms in another person. Work with your doctor to find foods that are safe for you. The items listed above may not be a complete list of what you can eat and drink. Contact a food expert for more options. What foods should I avoid? Limiting some of these foods may help in managing the symptoms of GERD. Everyone is different. Talk with a food expert or your doctor to help you find the exact foods to avoid, if any. Fruits Any fruits prepared with added fat. Any fruits that cause symptoms. For some people, this may include citrus fruits, such as oranges, grapefruit, pineapple, and lemons. Vegetables Deep-fried vegetables. [place]. Any vegetables prepared with added fat. Any vegetables that cause symptoms. For some people, this may include tomatoes and tomato products, chili peppers, onions and garlic, and horseradish. Grains Pastries or quick breads with added fat. Meats and other proteins High-fat meats, such as fatty beef or pork, hot dogs,  ribs, ham, sausage, salami, and bacon. Fried meat or protein, including fried fish and fried chicken. Nuts and nut butters, in large amounts. Dairy Whole milk and chocolate milk. Sour cream. Cream. Ice cream. Cream cheese. Milkshakes. Fats and oils Butter. Margarine. Shortening. Ghee. Beverages Coffee and tea, with or without caffeine. Carbonated beverages. Sodas. Energy drinks. Fruit juice made with acidic  fruits, such as orange or grapefruit. Tomato juice. Alcoholic drinks. Sweets and desserts Chocolate and cocoa. Donuts. Seasonings and condiments Pepper. Peppermint and spearmint. Added salt. Any condiments, herbs, or seasonings that cause symptoms. For some people, this may include curry, hot sauce, or vinegar-based salad dressings. The items listed above may not be a complete list of what you should not eat and drink. Contact a food expert for more options. Questions to ask your doctor Diet and lifestyle changes are often the first steps that are taken to manage symptoms of GERD. If diet and lifestyle changes do not help, talk with your doctor about taking medicines. Where to find more information International Foundation for Gastrointestinal Disorders: aboutgerd.org Summary When you have GERD, food and lifestyle choices are very important in easing your symptoms. Eat small meals often instead of 3 large meals a day. Eat your meals slowly and in a place where you are relaxed. Avoid bending over or lying down until 2-3 hours after eating. Limit high-fat foods such as fatty meats or fried foods. This information is not intended to replace advice given to you by your health care provider. Make sure you discuss any questions you have with your health care provider. Document Revised: 07/14/2019 Document Reviewed: 07/14/2019 Elsevier Patient Education  [place].  Prediabetes Prediabetes is when your blood sugar (blood glucose) level is higher than normal but not high enough for you to be diagnosed with type 2 diabetes. Having prediabetes puts you at risk for developing type 2 diabetes (type 2 diabetes mellitus). With certain lifestyle changes, you may be able to prevent or delay the onset of type 2 diabetes. This is important because type 2 diabetes can lead to serious complications, such as: Heart disease. Stroke. Blindness. Kidney disease. Depression. Poor circulation in the feet  and legs. In severe cases, this could lead to surgical removal of a leg (amputation). What are the causes? The exact cause of prediabetes is not known. It may result from insulin resistance. Insulin resistance develops when cells in the body do not respond properly to insulin that the body makes. This can cause excess glucose to build up in the blood. High blood glucose (hyperglycemia) can develop. What increases the risk? The following factors may make you more likely to develop this condition: You have a family member with type 2 diabetes. You are older than 45 years. You had a temporary form of diabetes during a pregnancy (gestational diabetes). You had polycystic ovary syndrome (PCOS). You are overweight or obese. You are inactive (sedentary). You have a history of heart disease, including problems with cholesterol levels, high levels of blood fats, or high blood pressure. What are the signs or symptoms? You may have no symptoms. If you do have symptoms, they may include: Increased hunger. Increased thirst. Increased urination. Vision changes, such as blurry vision. Tiredness (fatigue). How is this diagnosed? This condition can be diagnosed with blood tests. Your blood glucose may be checked with one or more of the following tests: A fasting blood glucose (FBG) test. You will not be allowed to eat (you will fast) for at least 8 hours  before a blood sample is taken. An A1C blood test (hemoglobin A1C). This test provides information about blood glucose levels over the previous 2?3 months. An oral glucose tolerance test (OGTT). This test measures your blood glucose at two points in time: After fasting. This is your baseline level. Two hours after you drink a beverage that contains glucose. You may be diagnosed with prediabetes if: Your FBG is 100?125 mg/dL (5.6-6.9 mmol/L). Your A1C level is 5.7?6.4% (39-46 mmol/mol). Your OGTT result is 140?199 mg/dL (7.8-11 mmol/L). These blood tests  may be repeated to confirm your diagnosis. How is this treated? Treatment may include dietary and lifestyle changes to help lower your blood glucose and prevent type 2 diabetes from developing. In some cases, medicine may be prescribed to help lower the risk of type 2 diabetes. Follow these instructions at home: Nutrition  Follow a healthy meal plan. This includes eating lean proteins, whole grains, legumes, fresh fruits and vegetables, low-fat dairy products, and healthy fats. Follow instructions from your health care provider about eating or drinking restrictions. Meet with a dietitian to create a healthy eating plan that is right for you. Lifestyle Do moderate-intensity exercise for at least 30 minutes a day on 5 or more days each week, or as told by your health care provider. A mix of activities may be best, such as: Brisk walking, swimming, biking, and weight lifting. Lose weight as told by your health care provider. Losing 5-7% of your body weight can reverse insulin resistance. Do not drink alcohol if: Your health care provider tells you not to drink. You are pregnant, may be pregnant, or are planning to become pregnant. If you drink alcohol: Limit how much you use to: 0-1 drink a day for women. 0-2 drinks a day for men. Be aware of how much alcohol is in your drink. In the [place] one drink equals one 12 oz bottle of beer (355 mL), one 5 oz glass of wine (148 mL), or one 1 oz glass of hard liquor (44 mL). General instructions Take over-the-counter and prescription medicines only as told by your health care provider. You may be prescribed medicines that help lower the risk of type 2 diabetes. Do not use any products that contain nicotine or tobacco, such as cigarettes, e-cigarettes, and chewing tobacco. If you need help quitting, ask your health care provider. Keep all follow-up visits. This is important. Where to find more information American Diabetes Association:  www.diabetes.org Academy of Nutrition and Dietetics: www.eatright.org American Heart Association: www.heart.org Contact a health care provider if: You have any of these symptoms: Increased hunger. Increased urination. Increased thirst. Fatigue. Vision changes, such as blurry vision. Get help right away if you: Have shortness of breath. Feel confused. Vomit or feel like you may vomit. Summary Prediabetes is when your blood sugar (blood glucose)level is higher than normal but not high enough for you to be diagnosed with type 2 diabetes. Having prediabetes puts you at risk for developing type 2 diabetes (type 2 diabetes mellitus). Make lifestyle changes such as eating a healthy diet and exercising regularly to help prevent diabetes. Lose weight as told by your health care provider. This information is not intended to replace advice given to you by your health care provider. Make sure you discuss any questions you have with your health care provider. Document Revised: 04/03/2019 Document Reviewed: 04/03/2019 Elsevier Patient Education  [place].

## 2021-12-13 LAB — LIPID PANEL
Chol/HDL Ratio: 2.4 ratio (ref 0.0–4.4)
Cholesterol, Total: 141 mg/dL (ref 100–199)
HDL: 58 mg/dL (ref >39)
LDL Chol Calc (NIH): 62 mg/dL (ref 0–99)
Triglycerides: 117 mg/dL (ref 0–149)
VLDL Cholesterol Cal: 21 mg/dL (ref 5–40)

## 2021-12-13 LAB — CBC WITH DIFFERENTIAL/PLATELET
Basophils Absolute: 0 10*3/uL (ref 0.0–0.2)
Basos: 1 %
EOS (ABSOLUTE): 0.2 10*3/uL (ref 0.0–0.4)
Eos: 4 %
Hematocrit: 43.2 % (ref 34.0–46.6)
Hemoglobin: 14.1 g/dL (ref 11.1–15.9)
Immature Grans (Abs): 0 10*3/uL (ref 0.0–0.1)
Immature Granulocytes: 0 %
Lymphocytes Absolute: 1.8 10*3/uL (ref 0.7–3.1)
Lymphs: 41 %
MCH: 30.3 pg (ref 26.6–33.0)
MCHC: 32.6 g/dL (ref 31.5–35.7)
MCV: 93 fL (ref 79–97)
Monocytes Absolute: 0.3 10*3/uL (ref 0.1–0.9)
Monocytes: 8 %
Neutrophils Absolute: 2 10*3/uL (ref 1.4–7.0)
Neutrophils: 46 %
Platelets: 228 10*3/uL (ref 150–450)
RBC: 4.66 x10E6/uL (ref 3.77–5.28)
RDW: 12.9 % (ref 11.7–15.4)
WBC: 4.3 10*3/uL (ref 3.4–10.8)

## 2021-12-13 LAB — COMPREHENSIVE METABOLIC PANEL
ALT: 17 IU/L (ref 0–32)
AST: 24 IU/L (ref 0–40)
Albumin/Globulin Ratio: 1.9 (ref 1.2–2.2)
Albumin: 4.3 g/dL (ref 3.8–4.8)
Alkaline Phosphatase: 57 IU/L (ref 44–121)
BUN/Creatinine Ratio: 13 (ref 12–28)
BUN: 12 mg/dL (ref 8–27)
Bilirubin Total: 0.6 mg/dL (ref 0.0–1.2)
CO2: 24 mmol/L (ref 20–29)
Calcium: 9.3 mg/dL (ref 8.7–10.3)
Chloride: 106 mmol/L (ref 96–106)
Creatinine, Ser: 0.9 mg/dL (ref 0.57–1.00)
Globulin, Total: 2.3 g/dL (ref 1.5–4.5)
Glucose: 111 mg/dL — ABNORMAL HIGH (ref 70–99)
Potassium: 4.5 mmol/L (ref 3.5–5.2)
Sodium: 145 mmol/L — ABNORMAL HIGH (ref 134–144)
Total Protein: 6.6 g/dL (ref 6.0–8.5)
eGFR: 66 mL/min/{1.73_m2} (ref >59)

## 2021-12-13 LAB — HEMOGLOBIN A1C
Est. average glucose Bld gHb Est-mCnc: 131 mg/dL
Hgb A1c MFr Bld: 6.2 % — ABNORMAL HIGH (ref 4.8–5.6)

## 2021-12-13 LAB — CARDIOVASCULAR RISK ASSESSMENT

## 2022-01-03 DIAGNOSIS — H59812 Chorioretinal scars after surgery for detachment, left eye: Secondary | ICD-10-CM | POA: Diagnosis not present

## 2022-01-03 DIAGNOSIS — H43813 Vitreous degeneration, bilateral: Secondary | ICD-10-CM | POA: Diagnosis not present

## 2022-01-19 ENCOUNTER — Other Ambulatory Visit: Payer: Self-pay | Admitting: Nurse Practitioner

## 2022-01-19 DIAGNOSIS — E782 Mixed hyperlipidemia: Secondary | ICD-10-CM

## 2022-01-23 ENCOUNTER — Other Ambulatory Visit: Payer: Self-pay | Admitting: Nurse Practitioner

## 2022-01-23 DIAGNOSIS — L570 Actinic keratosis: Secondary | ICD-10-CM | POA: Diagnosis not present

## 2022-01-23 DIAGNOSIS — Z1231 Encounter for screening mammogram for malignant neoplasm of breast: Secondary | ICD-10-CM

## 2022-01-23 DIAGNOSIS — L578 Other skin changes due to chronic exposure to nonionizing radiation: Secondary | ICD-10-CM | POA: Diagnosis not present

## 2022-01-23 DIAGNOSIS — L821 Other seborrheic keratosis: Secondary | ICD-10-CM | POA: Diagnosis not present

## 2022-02-24 ENCOUNTER — Other Ambulatory Visit: Payer: Self-pay | Admitting: Physician Assistant

## 2022-02-24 DIAGNOSIS — Z1231 Encounter for screening mammogram for malignant neoplasm of breast: Secondary | ICD-10-CM

## 2022-03-14 ENCOUNTER — Ambulatory Visit: Payer: PPO

## 2022-03-14 ENCOUNTER — Ambulatory Visit
Admission: RE | Admit: 2022-03-14 | Discharge: 2022-03-14 | Disposition: A | Discharge status: Home / Self Care | Payer: PPO | Source: Ambulatory Visit | Attending: Physician Assistant | Admitting: Physician Assistant

## 2022-03-14 DIAGNOSIS — Z1231 Encounter for screening mammogram for malignant neoplasm of breast: Secondary | ICD-10-CM

## 2022-04-18 ENCOUNTER — Other Ambulatory Visit: Payer: Self-pay | Admitting: Family Medicine

## 2022-04-18 DIAGNOSIS — E782 Mixed hyperlipidemia: Secondary | ICD-10-CM

## 2022-04-18 DIAGNOSIS — E038 Other specified hypothyroidism: Secondary | ICD-10-CM

## 2022-05-11 DIAGNOSIS — R1310 Dysphagia, unspecified: Secondary | ICD-10-CM | POA: Diagnosis not present

## 2022-05-11 DIAGNOSIS — K219 Gastro-esophageal reflux disease without esophagitis: Secondary | ICD-10-CM | POA: Diagnosis not present

## 2022-05-11 DIAGNOSIS — K5900 Constipation, unspecified: Secondary | ICD-10-CM | POA: Diagnosis not present

## 2022-05-17 DIAGNOSIS — R1310 Dysphagia, unspecified: Secondary | ICD-10-CM | POA: Diagnosis not present

## 2022-05-17 DIAGNOSIS — K219 Gastro-esophageal reflux disease without esophagitis: Secondary | ICD-10-CM | POA: Diagnosis not present

## 2022-06-15 ENCOUNTER — Encounter: Payer: Self-pay | Admitting: Physician Assistant

## 2022-06-15 ENCOUNTER — Ambulatory Visit (INDEPENDENT_AMBULATORY_CARE_PROVIDER_SITE_OTHER): Payer: PPO | Admitting: Physician Assistant

## 2022-06-15 VITALS — BP 118/66 | HR 91 | Temp 97.2°F | Ht 61.0 in | Wt 134.4 lb

## 2022-06-15 DIAGNOSIS — E782 Mixed hyperlipidemia: Secondary | ICD-10-CM | POA: Diagnosis not present

## 2022-06-15 DIAGNOSIS — R7309 Other abnormal glucose: Secondary | ICD-10-CM

## 2022-06-15 DIAGNOSIS — E039 Hypothyroidism, unspecified: Secondary | ICD-10-CM

## 2022-06-15 DIAGNOSIS — M8580 Other specified disorders of bone density and structure, unspecified site: Secondary | ICD-10-CM

## 2022-06-15 DIAGNOSIS — K2100 Gastro-esophageal reflux disease with esophagitis, without bleeding: Secondary | ICD-10-CM

## 2022-06-15 DIAGNOSIS — E559 Vitamin D deficiency, unspecified: Secondary | ICD-10-CM | POA: Diagnosis not present

## 2022-06-15 DIAGNOSIS — R7303 Prediabetes: Secondary | ICD-10-CM | POA: Diagnosis not present

## 2022-06-15 HISTORY — DX: Other abnormal glucose: R73.09

## 2022-06-15 NOTE — Progress Notes (Signed)
Subjective:  Patient ID: Kelly Horn, female    DOB: [birth year]  Age: 77 y.o. MRN: [id number]  Chief Complaint  Patient presents with   Medical Management of Chronic Issues   PT NEW TO ME _ DR PERRY/SHANNON PT HPI  Mixed hyperlipidemia  Pt presents with hyperlipidemia. Compliance with treatment has been good The patient is compliant with medications, maintains a low cholesterol diet , follows up as directed , and maintains an exercise regimen . The patient denies experiencing any hypercholesterolemia related symptoms. Currently taking zocor 20mg  qd  Pt with history of GERD - stable on prilosec 20mg  qd  Pt with history of hypothyroidism - taking synthroid qd - voices no problems or concerns  Pt with history of osteopenia - is on vit D and calcium supplements - is up to date with dexa scan       06/15/2022    1:41 PM 06/06/2021    7:34 AM 02/11/2021    8:13 AM 12/17/2019    3:17 PM 06/03/2019    9:44 AM  Depression screen PHQ 2/9  Decreased Interest 0 1 0 0 0  Down, Depressed, Hopeless 1 1 0 1   PHQ - 2 Score 1 2 0 1 0  Altered sleeping  0   0  Tired, decreased energy  1   1  Change in appetite  0   1  Feeling bad or failure about yourself   0   0  Trouble concentrating  0   0  Moving slowly or fidgety/restless  0   1  Suicidal thoughts  0   0  PHQ-9 Score  3   3  Difficult doing work/chores  Not difficult at all   Not difficult at all        06/03/2019    7:48 AM 11/13/2019   11:06 AM 12/17/2019    3:16 PM 02/11/2021    8:13 AM 06/15/2022    1:41 PM  Fall Risk  Falls in the past year?  0 0 0 0  Was there an injury with Fall? 0 0 0 0 0  Fall Risk Category Calculator  0 0 0 0  Fall Risk Category (Retired)  Low Low Low   (RETIRED) Patient Fall Risk Level  Low fall risk Low fall risk Low fall risk   Patient at Risk for Falls Due to    No Fall Risks No Fall Risks  Fall risk Follow up Falls evaluation completed Falls evaluation completed  Falls evaluation completed  Falls evaluation completed     ROS CONSTITUTIONAL: Negative for chills, fatigue, fever, unintentional weight gain and unintentional weight loss.  E/N/T: Negative for ear pain, nasal congestion and sore throat.  CARDIOVASCULAR: Negative for chest pain, dizziness, palpitations and pedal edema.  RESPIRATORY: Negative for recent cough and dyspnea.  GASTROINTESTINAL: Negative for abdominal pain, acid reflux symptoms, constipation, diarrhea, nausea and vomiting.  MSK: Negative for arthralgias and myalgias.  INTEGUMENTARY: Negative for rash.  NEUROLOGICAL: Negative for dizziness and headaches.  PSYCHIATRIC: Negative for sleep disturbance and to question depression screen.  Negative for depression, negative for anhedonia.    Current Outpatient Medications:    Calcium Carbonate-Vit D-Min (CALCIUM 1200 PO), Take 1,200 mg by mouth daily., Disp: , Rfl:    cetirizine (ZYRTEC) 10 MG tablet, Take 10 mg by mouth daily., Disp: , Rfl:    fluticasone (FLONASE) 50 MCG/ACT nasal spray, Place 2 sprays into both nostrils daily., Disp: 16 g, Rfl: 6  Investigational vitamin D 600 UNITS capsule SWOG [id number], Take 600 Units by mouth daily. Take with food., Disp: , Rfl:    levothyroxine (SYNTHROID) 50 MCG tablet, TAKE 1 TABLET BY MOUTH DAILY, Disp: 90 tablet, Rfl: 0   omeprazole (PRILOSEC) 20 MG capsule, Take 20 mg by mouth daily., Disp: , Rfl:    simvastatin (ZOCOR) 20 MG tablet, TAKE 1 TABLET BY MOUTH DAILY, Disp: 90 tablet, Rfl: 0  Past Medical History:  Diagnosis Date   Age-related osteoporosis without current pathological fracture    GERD with esophagitis    Mixed hyperlipidemia    Other specified hypothyroidism    Type 2 diabetes mellitus with other specified complication (HCC)    Objective:  PHYSICAL EXAM:   BP 118/66 (BP Location: Left Arm, Patient Position: Sitting, Cuff Size: Normal)   Pulse 91   Temp (!) 97.2 F (36.2 C) (Temporal)   Ht 5\' 1"  (1.549 m)   Wt 134 lb 6.4 oz (61 kg)   SpO2 96%    BMI 25.39 kg/m    GEN: Well nourished, well developed, in no acute distress  HEENT: normal external ears and nose - normal external auditory canals and TMS - hearing grossly normal - - Lips, Teeth and Gums - normal  Oropharynx - normal mucosa, palate, and posterior pharynx Cardiac: RRR; no murmurs, rubs, or gallops,no edema  Respiratory:  normal respiratory rate and pattern with no distress - normal breath sounds with no rales, rhonchi, wheezes or rubs MS: no deformity or atrophy  Skin: warm and dry, no rash  Psych: euthymic mood, appropriate affect and demeanor  Assessment & Plan:    Prediabetes -     CBC with Differential/Platelet -     Comprehensive metabolic panel -     Hemoglobin A1c Watch diet Mixed hyperlipidemia -     Comprehensive metabolic panel -     Lipid panel Continue med Gastroesophageal reflux disease with esophagitis, unspecified whether hemorrhage Continue med Primary hypothyroidism -     TSH Continue med Vitamin D deficiency -     VITAMIN D 25 Hydroxy (Vit-D Deficiency, Fractures)  Osteopenia, unspecified location Continue calcium and vit D    Follow-up: Return in about 6 months (around 12/16/2022) for chronic fasting follow-up.  An After Visit Summary was printed and given to the patient.  Jettie Pagan [facility] [phone]

## 2022-06-16 LAB — COMPREHENSIVE METABOLIC PANEL
ALT: 25 IU/L (ref 0–32)
AST: 26 IU/L (ref 0–40)
Albumin/Globulin Ratio: 1.8 (ref 1.2–2.2)
Albumin: 4.6 g/dL (ref 3.8–4.8)
Alkaline Phosphatase: 72 IU/L (ref 44–121)
BUN/Creatinine Ratio: 13 (ref 12–28)
BUN: 11 mg/dL (ref 8–27)
Bilirubin Total: 0.6 mg/dL (ref 0.0–1.2)
CO2: 24 mmol/L (ref 20–29)
Calcium: 9.5 mg/dL (ref 8.7–10.3)
Chloride: 103 mmol/L (ref 96–106)
Creatinine, Ser: 0.82 mg/dL (ref 0.57–1.00)
Globulin, Total: 2.5 g/dL (ref 1.5–4.5)
Glucose: 106 mg/dL — ABNORMAL HIGH (ref 70–99)
Potassium: 4.9 mmol/L (ref 3.5–5.2)
Sodium: 143 mmol/L (ref 134–144)
Total Protein: 7.1 g/dL (ref 6.0–8.5)
eGFR: 73 mL/min/{1.73_m2} (ref >59)

## 2022-06-16 LAB — VITAMIN D 25 HYDROXY (VIT D DEFICIENCY, FRACTURES): Vit D, 25-Hydroxy: 131 ng/mL — ABNORMAL HIGH (ref 30.0–100.0)

## 2022-06-16 LAB — TSH: TSH: 5.14 u[IU]/mL — ABNORMAL HIGH (ref 0.450–4.500)

## 2022-06-16 LAB — LIPID PANEL
Chol/HDL Ratio: 3 ratio (ref 0.0–4.4)
Cholesterol, Total: 175 mg/dL (ref 100–199)
HDL: 59 mg/dL (ref >39)
LDL Chol Calc (NIH): 100 mg/dL — ABNORMAL HIGH (ref 0–99)
Triglycerides: 90 mg/dL (ref 0–149)
VLDL Cholesterol Cal: 16 mg/dL (ref 5–40)

## 2022-06-16 LAB — CARDIOVASCULAR RISK ASSESSMENT

## 2022-06-16 LAB — CBC WITH DIFFERENTIAL/PLATELET
Basophils Absolute: 0 10*3/uL (ref 0.0–0.2)
Basos: 1 %
EOS (ABSOLUTE): 0.1 10*3/uL (ref 0.0–0.4)
Eos: 2 %
Hematocrit: 41 % (ref 34.0–46.6)
Hemoglobin: 13.8 g/dL (ref 11.1–15.9)
Immature Grans (Abs): 0 10*3/uL (ref 0.0–0.1)
Immature Granulocytes: 0 %
Lymphocytes Absolute: 2 10*3/uL (ref 0.7–3.1)
Lymphs: 34 %
MCH: 30.7 pg (ref 26.6–33.0)
MCHC: 33.7 g/dL (ref 31.5–35.7)
MCV: 91 fL (ref 79–97)
Monocytes Absolute: 0.4 10*3/uL (ref 0.1–0.9)
Monocytes: 7 %
Neutrophils Absolute: 3.3 10*3/uL (ref 1.4–7.0)
Neutrophils: 56 %
Platelets: 235 10*3/uL (ref 150–450)
RBC: 4.5 x10E6/uL (ref 3.77–5.28)
RDW: 12.4 % (ref 11.7–15.4)
WBC: 5.8 10*3/uL (ref 3.4–10.8)

## 2022-06-16 LAB — HEMOGLOBIN A1C
Est. average glucose Bld gHb Est-mCnc: 137 mg/dL
Hgb A1c MFr Bld: 6.4 % — ABNORMAL HIGH (ref 4.8–5.6)

## 2022-06-19 ENCOUNTER — Other Ambulatory Visit: Payer: Self-pay | Admitting: Physician Assistant

## 2022-06-19 DIAGNOSIS — E039 Hypothyroidism, unspecified: Secondary | ICD-10-CM

## 2022-06-19 MED ORDER — LEVOTHYROXINE SODIUM 75 MCG PO TABS
75.0000 ug | ORAL_TABLET | Freq: Every day | ORAL | 3 refills | Status: DC
Start: 2022-06-19 — End: 2022-08-21

## 2022-06-20 DIAGNOSIS — K635 Polyp of colon: Secondary | ICD-10-CM | POA: Diagnosis not present

## 2022-06-20 DIAGNOSIS — K5730 Diverticulosis of large intestine without perforation or abscess without bleeding: Secondary | ICD-10-CM | POA: Diagnosis not present

## 2022-06-20 DIAGNOSIS — D126 Benign neoplasm of colon, unspecified: Secondary | ICD-10-CM | POA: Diagnosis not present

## 2022-06-20 DIAGNOSIS — E039 Hypothyroidism, unspecified: Secondary | ICD-10-CM | POA: Diagnosis not present

## 2022-06-20 DIAGNOSIS — Z1211 Encounter for screening for malignant neoplasm of colon: Secondary | ICD-10-CM | POA: Diagnosis not present

## 2022-06-20 DIAGNOSIS — K648 Other hemorrhoids: Secondary | ICD-10-CM | POA: Diagnosis not present

## 2022-06-20 DIAGNOSIS — K449 Diaphragmatic hernia without obstruction or gangrene: Secondary | ICD-10-CM | POA: Diagnosis not present

## 2022-06-20 DIAGNOSIS — K5733 Diverticulitis of large intestine without perforation or abscess with bleeding: Secondary | ICD-10-CM | POA: Diagnosis not present

## 2022-06-20 DIAGNOSIS — K222 Esophageal obstruction: Secondary | ICD-10-CM | POA: Diagnosis not present

## 2022-06-20 DIAGNOSIS — Z800 Family history of malignant neoplasm of digestive organs: Secondary | ICD-10-CM | POA: Diagnosis not present

## 2022-06-20 DIAGNOSIS — K219 Gastro-esophageal reflux disease without esophagitis: Secondary | ICD-10-CM | POA: Diagnosis not present

## 2022-06-20 DIAGNOSIS — K644 Residual hemorrhoidal skin tags: Secondary | ICD-10-CM | POA: Diagnosis not present

## 2022-06-20 DIAGNOSIS — R1310 Dysphagia, unspecified: Secondary | ICD-10-CM | POA: Diagnosis not present

## 2022-06-20 LAB — HM COLONOSCOPY

## 2022-07-07 DIAGNOSIS — H43813 Vitreous degeneration, bilateral: Secondary | ICD-10-CM | POA: Diagnosis not present

## 2022-07-07 DIAGNOSIS — H59812 Chorioretinal scars after surgery for detachment, left eye: Secondary | ICD-10-CM | POA: Diagnosis not present

## 2022-07-17 ENCOUNTER — Telehealth: Payer: Self-pay

## 2022-07-17 NOTE — Telephone Encounter (Signed)
Contacted Kelly Horn to schedule their annual wellness visit. Patient declined to schedule AWV at this time.

## 2022-07-19 ENCOUNTER — Other Ambulatory Visit: Payer: Self-pay | Admitting: Family Medicine

## 2022-07-19 DIAGNOSIS — E782 Mixed hyperlipidemia: Secondary | ICD-10-CM

## 2022-08-09 DIAGNOSIS — K921 Melena: Secondary | ICD-10-CM | POA: Diagnosis not present

## 2022-08-09 DIAGNOSIS — R1310 Dysphagia, unspecified: Secondary | ICD-10-CM | POA: Diagnosis not present

## 2022-08-09 DIAGNOSIS — A048 Other specified bacterial intestinal infections: Secondary | ICD-10-CM | POA: Diagnosis not present

## 2022-08-09 DIAGNOSIS — K219 Gastro-esophageal reflux disease without esophagitis: Secondary | ICD-10-CM | POA: Diagnosis not present

## 2022-08-17 ENCOUNTER — Other Ambulatory Visit: Payer: PPO

## 2022-08-17 DIAGNOSIS — E039 Hypothyroidism, unspecified: Secondary | ICD-10-CM | POA: Diagnosis not present

## 2022-08-21 ENCOUNTER — Other Ambulatory Visit: Payer: Self-pay | Admitting: Physician Assistant

## 2022-08-21 DIAGNOSIS — E039 Hypothyroidism, unspecified: Secondary | ICD-10-CM

## 2022-08-21 MED ORDER — LEVOTHYROXINE SODIUM 50 MCG PO TABS
50.0000 ug | ORAL_TABLET | Freq: Every day | ORAL | 2 refills | Status: DC
Start: 2022-08-21 — End: 2022-11-08

## 2022-08-22 DIAGNOSIS — L570 Actinic keratosis: Secondary | ICD-10-CM | POA: Diagnosis not present

## 2022-08-22 DIAGNOSIS — L638 Other alopecia areata: Secondary | ICD-10-CM | POA: Diagnosis not present

## 2022-08-22 DIAGNOSIS — L821 Other seborrheic keratosis: Secondary | ICD-10-CM | POA: Diagnosis not present

## 2022-10-17 ENCOUNTER — Other Ambulatory Visit: Payer: PPO

## 2022-10-17 DIAGNOSIS — E039 Hypothyroidism, unspecified: Secondary | ICD-10-CM | POA: Diagnosis not present

## 2022-10-18 LAB — TSH: TSH: 2.41 u[IU]/mL (ref 0.450–4.500)

## 2022-10-20 ENCOUNTER — Other Ambulatory Visit: Payer: Self-pay | Admitting: Physician Assistant

## 2022-10-20 DIAGNOSIS — E782 Mixed hyperlipidemia: Secondary | ICD-10-CM

## 2022-11-08 ENCOUNTER — Other Ambulatory Visit: Payer: Self-pay | Admitting: Physician Assistant

## 2022-11-08 DIAGNOSIS — E782 Mixed hyperlipidemia: Secondary | ICD-10-CM

## 2022-11-08 DIAGNOSIS — E039 Hypothyroidism, unspecified: Secondary | ICD-10-CM

## 2022-12-19 ENCOUNTER — Ambulatory Visit: Payer: PPO | Admitting: Physician Assistant

## 2022-12-20 ENCOUNTER — Ambulatory Visit (INDEPENDENT_AMBULATORY_CARE_PROVIDER_SITE_OTHER): Payer: PPO | Admitting: Physician Assistant

## 2022-12-20 ENCOUNTER — Encounter: Payer: Self-pay | Admitting: Physician Assistant

## 2022-12-20 VITALS — BP 134/64 | HR 94 | Temp 98.2°F | Resp 16 | Ht 61.0 in | Wt 129.6 lb

## 2022-12-20 DIAGNOSIS — K2100 Gastro-esophageal reflux disease with esophagitis, without bleeding: Secondary | ICD-10-CM

## 2022-12-20 DIAGNOSIS — E039 Hypothyroidism, unspecified: Secondary | ICD-10-CM | POA: Diagnosis not present

## 2022-12-20 DIAGNOSIS — E782 Mixed hyperlipidemia: Secondary | ICD-10-CM

## 2022-12-20 DIAGNOSIS — M8580 Other specified disorders of bone density and structure, unspecified site: Secondary | ICD-10-CM

## 2022-12-20 DIAGNOSIS — R0789 Other chest pain: Secondary | ICD-10-CM | POA: Diagnosis not present

## 2022-12-20 DIAGNOSIS — R7303 Prediabetes: Secondary | ICD-10-CM | POA: Diagnosis not present

## 2022-12-20 DIAGNOSIS — E559 Vitamin D deficiency, unspecified: Secondary | ICD-10-CM

## 2022-12-20 DIAGNOSIS — R0609 Other forms of dyspnea: Secondary | ICD-10-CM

## 2022-12-20 DIAGNOSIS — R5383 Other fatigue: Secondary | ICD-10-CM

## 2022-12-20 HISTORY — DX: Other chest pain: R07.89

## 2022-12-20 HISTORY — DX: Other specified disorders of bone density and structure, unspecified site: M85.80

## 2022-12-20 HISTORY — DX: Other forms of dyspnea: R06.09

## 2022-12-20 HISTORY — DX: Other fatigue: R53.83

## 2022-12-20 HISTORY — DX: Hypothyroidism, unspecified: E03.9

## 2022-12-20 HISTORY — DX: Vitamin D deficiency, unspecified: E55.9

## 2022-12-20 MED ORDER — METOPROLOL SUCCINATE ER 25 MG PO TB24: 25.0000 mg | ORAL_TABLET | Freq: Every day | ORAL | 1 refills | Status: DC

## 2022-12-20 MED ORDER — ASPIRIN 81 MG PO TBEC: 81.0000 mg | DELAYED_RELEASE_TABLET | Freq: Every day | ORAL | Status: DC

## 2022-12-20 NOTE — Progress Notes (Signed)
Subjective:  Patient ID: Kelly Horn, female    DOB: [birth year]  Age: 77 y.o. MRN: [id number]  Chief Complaint  Patient presents with   Medical Management of Chronic Issues    HPI Pt in for chronic issues today but also having some problems  Pt states that for the past 6-8 months she has had intermittent chest tightness.  She states that she notices the symptoms of fatigue and exertional dyspnea with activities also (working in yard, etc) that started about the same time as well.  She has never seen cardiology.  She has hyperlipidemia and takes zocor 20mg  qd She states her last symptom of chest tightness was last night  Pt with hypothyroidism - is taking synthroid qd - having some fatigue otherwise no other symptoms  Pt with GERD - stable on prilosec 20mg  qd     12/20/2022    9:49 AM 06/15/2022    1:41 PM 06/06/2021    7:34 AM 02/11/2021    8:13 AM 12/17/2019    3:17 PM  Depression screen PHQ 2/9  Decreased Interest 2 0 1 0 0  Down, Depressed, Hopeless 1 1 1  0 1  PHQ - 2 Score 3 1 2  0 1  Altered sleeping 0  0    Tired, decreased energy 1  1    Change in appetite 2  0    Feeling bad or failure about yourself  0  0    Trouble concentrating 0  0    Moving slowly or fidgety/restless 0  0    Suicidal thoughts 0  0    PHQ-9 Score 6  3    Difficult doing work/chores Not difficult at all  Not difficult at all          11/13/2019   11:06 AM 12/17/2019    3:16 PM 02/11/2021    8:13 AM 06/15/2022    1:41 PM 12/20/2022    9:48 AM  Fall Risk  Falls in the past year? 0 0 0 0 0  Was there an injury with Fall? 0 0 0 0 0  Fall Risk Category Calculator 0 0 0 0 0  Fall Risk Category (Retired) Low Low Low    (RETIRED) Patient Fall Risk Level Low fall risk Low fall risk Low fall risk    Patient at Risk for Falls Due to   No Fall Risks No Fall Risks No Fall Risks  Fall risk Follow up Falls evaluation completed  Falls evaluation completed Falls evaluation completed Falls evaluation  completed     ROS CONSTITUTIONAL: see HPI E/N/T: Negative for ear pain, nasal congestion and sore throat.  CARDIOVASCULAR: see HPI RESPIRATORY: see HPI GASTROINTESTINAL: Negative for abdominal pain, acid reflux symptoms, constipation, diarrhea, nausea and vomiting.  MSK: Negative for arthralgias and myalgias.  INTEGUMENTARY: Negative for rash.  NEUROLOGICAL: Negative for dizziness and headaches.  PSYCHIATRIC: Negative for sleep disturbance and to question depression screen.  Negative for depression, negative for anhedonia.    Current Outpatient Medications:    Calcium Carbonate-Vit D-Min (CALCIUM 1200 PO), Take 1,200 mg by mouth daily., Disp: , Rfl:    cetirizine (ZYRTEC) 10 MG tablet, Take 10 mg by mouth daily., Disp: , Rfl:    fluticasone (FLONASE) 50 MCG/ACT nasal spray, Place 2 sprays into both nostrils daily., Disp: 16 g, Rfl: 6   levothyroxine (SYNTHROID) 50 MCG tablet, TAKE 1 TABLET BY MOUTH DAILY, Disp: 30 tablet, Rfl: 1   metoprolol succinate (TOPROL-XL) 25 MG 24  hr tablet, Take 1 tablet (25 mg total) by mouth daily., Disp: 30 tablet, Rfl: 1   omeprazole (PRILOSEC) 20 MG capsule, Take 20 mg by mouth daily., Disp: , Rfl:    simvastatin (ZOCOR) 20 MG tablet, TAKE 1 TABLET BY MOUTH DAILY (Patient not taking: Reported on 12/20/2022), Disp: 90 tablet, Rfl: 0  Past Medical History:  Diagnosis Date   Age-related osteoporosis without current pathological fracture    GERD with esophagitis    Mixed hyperlipidemia    Other specified hypothyroidism    Type 2 diabetes mellitus with other specified complication (HCC)    Objective:  PHYSICAL EXAM:   BP 134/64 (BP Location: Left Arm, Patient Position: Sitting, Cuff Size: Normal)   Pulse 94   Temp 98.2 F (36.8 C) (Temporal)   Resp 16   Ht 5\' 1"  (1.549 m)   Wt 129 lb 9.6 oz (58.8 kg)   SpO2 97%   BMI 24.49 kg/m    GEN: Well nourished, well developed, in no acute distress  HEENT: normal external ears and nose - normal external  auditory canals and TMS -  - Lips, Teeth and Gums - normal  Oropharynx - normal mucosa, palate, and posterior pharynx Cardiac: RRR; no murmurs, rubs, or gallops,no edema -  Respiratory:  normal respiratory rate and pattern with no distress - normal breath sounds with no rales, rhonchi, wheezes or rubs GI: normal bowel sounds, no masses or tenderness Skin: warm and dry, no rash  Psych: euthymic mood, appropriate affect and demeanor  EKG normal  Assessment & Plan:    Other chest pain -     EKG 12-Lead -     CBC with Differential/Platelet -     Comprehensive metabolic panel -     T4, free -     TSH -     Troponin I - done at hospital - result negative -     Ambulatory referral to Cardiology -     Metoprolol Succinate ER; Take 1 tablet (25 mg total) by mouth daily.  Dispense: 30 tablet; Refill: 1 Start baby asa 81mg  qd Acquired hypothyroidism -     TSH  Prediabetes -     Comprehensive metabolic panel -     Hemoglobin A1c  Mixed hyperlipidemia -     Comprehensive metabolic panel -     Lipid panel  Gastroesophageal reflux disease with esophagitis, unspecified whether hemorrhage Continue omeprazole Vitamin D deficiency -     VITAMIN D 25 Hydroxy (Vit-D Deficiency, Fractures)  Exertional dyspnea -     Ambulatory referral to Cardiology  Other fatigue -     CBC with Differential/Platelet -     T4, free -     TSH -     Iron, TIBC and Ferritin Panel -     B12 and Folate Panel     Follow-up: Return in about 6 weeks (around 01/31/2023) for follow-up.  An After Visit Summary was printed and given to the patient.  Jettie Pagan [facility] [phone]

## 2022-12-21 DIAGNOSIS — L821 Other seborrheic keratosis: Secondary | ICD-10-CM | POA: Diagnosis not present

## 2022-12-21 DIAGNOSIS — L578 Other skin changes due to chronic exposure to nonionizing radiation: Secondary | ICD-10-CM | POA: Diagnosis not present

## 2022-12-21 DIAGNOSIS — L570 Actinic keratosis: Secondary | ICD-10-CM | POA: Diagnosis not present

## 2022-12-21 LAB — CBC WITH DIFFERENTIAL/PLATELET
Basophils Absolute: 0 10*3/uL (ref 0.0–0.2)
Basos: 1 %
EOS (ABSOLUTE): 0.1 10*3/uL (ref 0.0–0.4)
Eos: 2 %
Hematocrit: 42.9 % (ref 34.0–46.6)
Hemoglobin: 14.3 g/dL (ref 11.1–15.9)
Immature Grans (Abs): 0 10*3/uL (ref 0.0–0.1)
Immature Granulocytes: 0 %
Lymphocytes Absolute: 1.4 10*3/uL (ref 0.7–3.1)
Lymphs: 32 %
MCH: 30.6 pg (ref 26.6–33.0)
MCHC: 33.3 g/dL (ref 31.5–35.7)
MCV: 92 fL (ref 79–97)
Monocytes Absolute: 0.3 10*3/uL (ref 0.1–0.9)
Monocytes: 6 %
Neutrophils Absolute: 2.6 10*3/uL (ref 1.4–7.0)
Neutrophils: 59 %
Platelets: 230 10*3/uL (ref 150–450)
RBC: 4.68 x10E6/uL (ref 3.77–5.28)
RDW: 12.6 % (ref 11.7–15.4)
WBC: 4.3 10*3/uL (ref 3.4–10.8)

## 2022-12-21 LAB — T4, FREE: Free T4: 1.23 ng/dL (ref 0.82–1.77)

## 2022-12-21 LAB — COMPREHENSIVE METABOLIC PANEL
ALT: 17 [IU]/L (ref 0–32)
AST: 22 [IU]/L (ref 0–40)
Albumin: 4.7 g/dL (ref 3.8–4.8)
Alkaline Phosphatase: 76 [IU]/L (ref 44–121)
BUN/Creatinine Ratio: 17 (ref 12–28)
BUN: 13 mg/dL (ref 8–27)
Bilirubin Total: 0.6 mg/dL (ref 0.0–1.2)
CO2: 24 mmol/L (ref 20–29)
Calcium: 9.5 mg/dL (ref 8.7–10.3)
Chloride: 107 mmol/L — ABNORMAL HIGH (ref 96–106)
Creatinine, Ser: 0.76 mg/dL (ref 0.57–1.00)
Globulin, Total: 2.3 g/dL (ref 1.5–4.5)
Glucose: 107 mg/dL — ABNORMAL HIGH (ref 70–99)
Potassium: 4.5 mmol/L (ref 3.5–5.2)
Sodium: 145 mmol/L — ABNORMAL HIGH (ref 134–144)
Total Protein: 7 g/dL (ref 6.0–8.5)
eGFR: 80 mL/min/{1.73_m2} (ref >59)

## 2022-12-21 LAB — IRON,TIBC AND FERRITIN PANEL
Ferritin: 147 ng/mL (ref 15–150)
Iron Saturation: 37 % (ref 15–55)
Iron: 102 ug/dL (ref 27–139)
Total Iron Binding Capacity: 273 ug/dL (ref 250–450)
UIBC: 171 ug/dL (ref 118–369)

## 2022-12-21 LAB — LIPID PANEL
Chol/HDL Ratio: 2.5 {ratio} (ref 0.0–4.4)
Cholesterol, Total: 142 mg/dL (ref 100–199)
HDL: 57 mg/dL (ref >39)
LDL Chol Calc (NIH): 68 mg/dL (ref 0–99)
Triglycerides: 93 mg/dL (ref 0–149)
VLDL Cholesterol Cal: 17 mg/dL (ref 5–40)

## 2022-12-21 LAB — TSH: TSH: 3.86 u[IU]/mL (ref 0.450–4.500)

## 2022-12-21 LAB — B12 AND FOLATE PANEL
Folate: 13.9 ng/mL (ref >3.0)
Vitamin B-12: 520 pg/mL (ref 232–1245)

## 2022-12-21 LAB — VITAMIN D 25 HYDROXY (VIT D DEFICIENCY, FRACTURES): Vit D, 25-Hydroxy: 60.4 ng/mL (ref 30.0–100.0)

## 2022-12-21 LAB — HEMOGLOBIN A1C
Est. average glucose Bld gHb Est-mCnc: 131 mg/dL
Hgb A1c MFr Bld: 6.2 % — ABNORMAL HIGH (ref 4.8–5.6)

## 2022-12-26 ENCOUNTER — Other Ambulatory Visit: Payer: Self-pay | Admitting: Family Medicine

## 2022-12-26 DIAGNOSIS — J0181 Other acute recurrent sinusitis: Secondary | ICD-10-CM

## 2022-12-28 ENCOUNTER — Ambulatory Visit: Payer: PPO

## 2022-12-28 VITALS — BP 132/60 | HR 76 | Ht 61.0 in | Wt 129.6 lb

## 2022-12-28 DIAGNOSIS — R0789 Other chest pain: Secondary | ICD-10-CM

## 2022-12-28 NOTE — Assessment & Plan Note (Signed)
Atypical chest pain and exertional fatigue Does have significant cardiovascular risk factors.  She is currently on aspirin 81 mg once daily and simvastatin 20 mg once daily. Continue these medications.  Will further risk stratify with a treadmill exercise stress test with nuclear imaging.

## 2022-12-28 NOTE — Progress Notes (Signed)
Cardiology Consultation:    Date:  12/28/2022   ID:  Kelly Horn, DOB 24-Dec-1945, MRN [id number]  PCP:  Marianne Sofia, PA-C  Cardiologist:  Marlyn Corporal Pookela Sellin, MD   Referring MD: Marianne Sofia, PA-C   No chief complaint on file.    ASSESSMENT AND PLAN:   [name] 77 year old woman with history of prediabetes, hyperlipidemia, hypothyroidism, GERD, here for evaluation of mild decrease in effort tolerance with a sensation of fatigue with exertion and atypical chest discomfort. Problem List Items Addressed This Visit     Atypical chest pain - Primary   Atypical chest pain and exertional fatigue Does have significant cardiovascular risk factors.  She is currently on aspirin 81 mg once daily and simvastatin 20 mg once daily. Continue these medications.  Will further risk stratify with a treadmill exercise stress test with nuclear imaging.      Relevant Orders   EKG 12-Lead (Completed)   Return to clinic based on test results.   History of Present Illness:    Kelly Horn is a 77 y.o. female who is being seen today for the evaluation of chest pain and exertional dyspnea, at the request of Marianne Sofia, [name].   Has history of GERD, hypothyroidism, prediabetes, hyperlipidemia. No prior history of CAD, CHF, MI, CVA.  Very pleasant woman here for the visit by herself. Lives at home with her husband.  Mentions it has been stressful for the last few months with her husband's health issues. She keeps herself active with household work and recently having to do more with her husband's surgeries limiting him. She has a twin identical sister who lives nearby and they go on walks regularly around the neighborhood walking up and down an incline.  She denies any significant symptoms of shortness of breath or chest pain with exertion but appears to have noticed some degree of easy fatigability with activities that she she was otherwise doing without discomfort. Denies any  lightheadedness, dizziness or syncopal episodes. Denies any significant weight gain or loss. Denies any pedal edema.  She reports instances of chest discomfort that were brief fleeting episodes on changing position.  Had few of those episodes on the night before she had visit with her PCP recently that prompted the workup and referral to see [place].  EKG in the clinic today shows sinus rhythm heart rate 76/min, normal PR interval 146 ms, normal QRS duration 86 ms, QTc 436 ms.  Recent blood work reviewed from 12/20/2022 High-sensitivity troponin I less than 0.01. CBC with hemoglobin 14.3, hematocrit 42.9, platelets 230, WBC 4.3. CMP with sodium 145, potassium 4.5, BUN 13, creatinine 0.76, EGFR 80 TSH normal 3.86, free T4 at 1.23 Lipid panel with total cholesterol 142, triglycerides 93, HDL 57, LDL 68 Hemoglobin E9B 6.2  Past Medical History:  Diagnosis Date   Age-related osteoporosis without current pathological fracture    GERD with esophagitis    Mixed hyperlipidemia    Other specified hypothyroidism    Type 2 diabetes mellitus with other specified complication (HCC)     Past Surgical History:  Procedure Laterality Date   CERVICAL DISC SURGERY     PARTIAL HYSTERECTOMY      Current Medications: Current Meds  Medication Sig   aspirin EC 81 MG tablet Take 1 tablet (81 mg total) by mouth daily. Swallow whole.   Calcium Carbonate-Vit D-Min (CALCIUM 1200 PO) Take 1,200 mg by mouth daily.   cetirizine (ZYRTEC) 10 MG tablet Take 10 mg by mouth daily.  finasteride (PROSCAR) 5 MG tablet Take 5 mg by mouth daily.   fluticasone (FLONASE) 50 MCG/ACT nasal spray INAHEL 2 SPRAYS INTO BOTH NOSTRILS DAILY.   levothyroxine (SYNTHROID) 50 MCG tablet TAKE 1 TABLET BY MOUTH DAILY   metoprolol succinate (TOPROL-XL) 25 MG 24 hr tablet Take 1 tablet (25 mg total) by mouth daily.   Multiple Vitamin (MULTI VITAMIN DAILY PO) Take 1 tablet by mouth daily.   omeprazole (PRILOSEC) 20 MG capsule Take 20 mg  by mouth daily.   simvastatin (ZOCOR) 20 MG tablet TAKE 1 TABLET BY MOUTH DAILY     Allergies:   Cimetidine   Social History   Socioeconomic History   Marital status: Married    Spouse name: Not on file   Number of children: 2   Years of education: Not on file   Highest education level: 12th grade  Occupational History   Occupation: retired  Tobacco Use   Smoking status: Never   Smokeless tobacco: Never  Vaping Use   Vaping status: Never Used  Substance and Sexual Activity   Alcohol use: Never   Drug use: Never   Sexual activity: Not on file  Other Topics Concern   Not on file  Social History Narrative   Not on file   Social Drivers of Health   Financial Resource Strain: Low Risk  (12/19/2022)   Overall Financial Resource Strain (CARDIA)    Difficulty of Paying Living Expenses: Not hard at all  Food Insecurity: No Food Insecurity (12/19/2022)   Hunger Vital Sign    Worried About Running Out of Food in the Last Year: Never true    Ran Out of Food in the Last Year: Never true  Transportation Needs: No Transportation Needs (12/19/2022)   PRAPARE - Administrator, Civil Service (Medical): No    Lack of Transportation (Non-Medical): No  Physical Activity: Sufficiently Active (12/19/2022)   Exercise Vital Sign    Days of Exercise per Week: 5 days    Minutes of Exercise per Session: 30 min  Stress: No Stress Concern Present (12/19/2022)   [name] of Occupational Health - Occupational Stress Questionnaire    Feeling of Stress : Only a little  Social Connections: Socially Integrated (12/19/2022)   Social Connection and Isolation Panel [NHANES]    Frequency of Communication with Friends and Family: More than three times a week    Frequency of Social Gatherings with Friends and Family: More than three times a week    Attends Religious Services: More than 4 times per year    Active Member of [employer] or Organizations: Yes    Attends Hospital doctor: More than 4 times per year    Marital Status: Married     Family History: The patient's family history includes Arthritis in an other family member; Cancer in her mother; Diabetes in an other family member; Heart attack in her father. ROS:   Please see the history of present illness.    All 14 point review of systems negative except as described per history of present illness.  EKGs/Labs/Other Studies Reviewed:    The following studies were reviewed today:   EKG:  EKG Interpretation Date/Time:  Thursday December 28 2022 16:05:21 EST Ventricular Rate:  76 PR Interval:  146 QRS Duration:  86 QT Interval:  388 QTC Calculation: 436 R Axis:   60  Text Interpretation: Normal sinus rhythm Normal ECG When compared with ECG of 01-Mar-2010 15:38, Questionable change in QRS axis  Confirmed by Huntley Dec reddy [id number]) on 12/28/2022 3:39:20 PM    Recent Labs: 12/20/2022: ALT 17; BUN 13; Creatinine, Ser 0.76; Hemoglobin 14.3; Platelets 230; Potassium 4.5; Sodium 145; TSH 3.860  Recent Lipid Panel    Component Value Date/Time   CHOL 142 12/20/2022 1048   TRIG 93 12/20/2022 1048   HDL 57 12/20/2022 1048   CHOLHDL 2.5 12/20/2022 1048   LDLCALC 68 12/20/2022 1048    Physical Exam:    VS:  BP 132/60   Pulse 76   Ht 5\' 1"  (1.549 m)   Wt 129 lb 9.6 oz (58.8 kg)   SpO2 96%   BMI 24.49 kg/m     Wt Readings from Last 3 Encounters:  12/28/22 129 lb 9.6 oz (58.8 kg)  12/20/22 129 lb 9.6 oz (58.8 kg)  06/15/22 134 lb 6.4 oz (61 kg)     GENERAL:  Well nourished, well developed in no acute distress NECK: No JVD; No carotid bruits CARDIAC: RRR, S1 and S2 present, no murmurs, no rubs, no gallops CHEST:  Clear to auscultation without rales, wheezing or rhonchi  Extremities: No pitting pedal edema. Pulses bilaterally symmetric with radial 2+ and dorsalis pedis 2+ NEUROLOGIC:  Alert and oriented x 3  Medication Adjustments/Labs and Tests Ordered: Current medicines are  reviewed at length with the patient today.  Concerns regarding medicines are outlined above.  Orders Placed This Encounter  Procedures   EKG 12-Lead   No orders of the defined types were placed in this encounter.   Signed, Cecille Amsterdam, MD, MPH, [facility]. 12/28/2022 4:07 PM    [facility]

## 2022-12-28 NOTE — Patient Instructions (Addendum)
Medication Instructions:   Do not take metoprolol (Lopressor, Toprol) for 24 hours prior to the test  *If you need a refill on your cardiac medications before your next appointment, please call your pharmacy*   Lab Work: None ordered If you have labs (blood work) drawn today and your tests are completely normal, you will receive your results only by: MyChart Message (if you have MyChart) OR A paper copy in the mail If you have any lab test that is abnormal or we need to change your treatment, we will call you to review the results.   Testing/Procedures: You are scheduled for a Myocardial Perfusion Imaging Study.  Please arrive 15 minutes prior to your appointment time for registration and insurance purposes.  The test will take approximately 3 to 4 hours to complete; you may bring reading material.  If someone comes with you to your appointment, they will need to remain in the main lobby due to limited space in the testing area.   How to prepare for your Myocardial Perfusion Test: Do not eat or drink 3 hours prior to your test, except you may have water. Do not consume products containing caffeine (regular or decaffeinated) 12 hours prior to your test. (ex: coffee, chocolate, sodas, tea). Do bring a list of your current medications with you.  If not listed below, you may take your medications as normal. Do not take metoprolol (Lopressor, Toprol) for 24 hours prior to the test.  Bring the medication to your appointment as you may be required to take it once the test is complete. Do wear comfortable clothes (no dresses or overalls) and walking shoes, tennis shoes preferred (No heels or open toe shoes are allowed). Do NOT wear cologne, perfume, aftershave, or lotions (deodorant is allowed). If these instructions are not followed, your test will have to be rescheduled.  If you cannot keep your appointment, please provide 24 hours notification to the Nuclear Lab, to avoid a possible $50  charge to your account.  Follow-Up: At [facility], you and your health needs are our priority.  As part of our continuing mission to provide you with exceptional heart care, we have created designated Provider Care Teams.  These Care Teams include your primary Cardiologist (physician) and Advanced Practice Providers (APPs -  Physician Assistants and Nurse Practitioners) who all work together to provide you with the care you need, when you need it.  We recommend signing up for the patient portal called "MyChart".  Sign up information is provided on this After Visit Summary.  MyChart is used to connect with patients for Virtual Visits (Telemedicine).  Patients are able to view lab/test results, encounter notes, upcoming appointments, etc.  Non-urgent messages can be sent to your provider as well.   To learn more about what you can do with MyChart, go to [email or web address].    Follow up based on test results.   Other Instructions  Cardiac Nuclear Scan A cardiac nuclear scan is a test that is done to check the flow of blood to your heart. It is done when you are resting and when you are exercising. The test looks for problems such as: Not enough blood reaching a portion of the heart. The heart muscle not working as it should. You may need this test if you have: Heart disease. Lab results that are not normal. Had heart surgery or a balloon procedure to open up blocked arteries (angioplasty) or a small mesh tube (stent). Chest pain. Shortness of breath. Had  a heart attack. In this test, a special dye (tracer) is put into your bloodstream. The tracer will travel to your heart. A camera will then take pictures of your heart to see how the tracer moves through your heart. This test is usually done at a hospital and takes 2-4 hours. Tell a doctor about: Any allergies you have. All medicines you are taking, including vitamins, herbs, eye drops, creams, and over-the-counter  medicines. Any bleeding problems you have. Any surgeries you have had. Any medical conditions you have. Whether you are pregnant or may be pregnant. Any history of asthma or long-term (chronic) lung disease. Any history of heart rhythm disorders or heart valve conditions. What are the risks? Your doctor will talk with you about risks. These may include: Serious chest pain and heart attack. This is only a risk if the stress portion of the test is done. Fast or uneven heartbeats (palpitations). A feeling of warmth in your chest. This feeling usually does not last long. Allergic reaction to the tracer. Shortness of breath or trouble breathing. What happens before the test? Ask your doctor about changing or stopping your normal medicines. Follow instructions from your doctor about what you cannot eat or drink. Remove your jewelry on the day of the test. Ask your doctor if you need to avoid nicotine or caffeine. What happens during the test? An IV tube will be inserted into one of your veins. Your doctor will give you a small amount of tracer through the IV tube. You will wait for 20-40 minutes while the tracer moves through your bloodstream. Your heart will be monitored with an electrocardiogram (ECG). You will lie down on an exam table. Pictures of your heart will be taken for about 15-20 minutes. You may also have a stress test. For this test, one of these things may be done: You will be asked to exercise on a treadmill or a stationary bike. You will be given medicines that will make your heart work harder. This is done if you are unable to exercise. When blood flow to your heart has peaked, a tracer will again be given through the IV tube. After 20-40 minutes, you will get back on the exam table. More pictures will be taken of your heart. Depending on the tracer that is used, more pictures may need to be taken 3-4 hours later. Your IV tube will be removed when the test is over. The  test may vary among doctors and hospitals. What happens after the test? Ask your doctor: Whether you can return to your normal schedule, including diet, activities, travel, and medicines. Whether you should drink more fluids. This will help to remove the tracer from your body. Ask your doctor, or the department that is doing the test: When will my results be ready? How will I get my results? What are my treatment options? What other tests do I need? What are my next steps? This information is not intended to replace advice given to you by your health care provider. Make sure you discuss any questions you have with your health care provider. Document Revised: 05/31/2021 Document Reviewed: 05/31/2021 Elsevier Patient Education  2023 [employer].

## 2022-12-28 NOTE — Addendum Note (Signed)
Addended by: Lonia Farber on: 12/28/2022 04:35 PM   Modules accepted: Orders

## 2023-01-02 ENCOUNTER — Encounter (HOSPITAL_COMMUNITY): Payer: Self-pay

## 2023-01-02 ENCOUNTER — Other Ambulatory Visit: Payer: Self-pay | Admitting: Physician Assistant

## 2023-01-02 DIAGNOSIS — E039 Hypothyroidism, unspecified: Secondary | ICD-10-CM

## 2023-01-04 ENCOUNTER — Ambulatory Visit: Payer: PPO

## 2023-01-04 DIAGNOSIS — R0789 Other chest pain: Secondary | ICD-10-CM | POA: Diagnosis not present

## 2023-01-04 LAB — MYOCARDIAL PERFUSION IMAGING
Estimated workload: 7
Exercise duration (min): 4 min
LV dias vol: 55 mL (ref 46–106)
LV sys vol: 15 mL
MPHR: 142 {beats}/min
Nuc Stress EF: 74 %
Peak HR: 142 {beats}/min
Percent HR: 100 %
RPE: 19
Rest HR: 77 {beats}/min
Rest Nuclear Isotope Dose: 10.2 mCi
SDS: 5
SRS: 3
SSS: 8
Stress Nuclear Isotope Dose: 29.7 mCi
TID: 1.17

## 2023-01-04 MED ORDER — TECHNETIUM TC 99M TETROFOSMIN IV KIT
10.2000 | PACK | Freq: Once | INTRAVENOUS | Status: AC | PRN
Start: 2023-01-04 — End: 2023-01-04
  Administered 2023-01-04: 10.2 via INTRAVENOUS

## 2023-01-04 MED ORDER — REGADENOSON 0.4 MG/5ML IV SOLN: 0.4000 mg | Freq: Once | INTRAVENOUS | Status: DC

## 2023-01-04 MED ORDER — TECHNETIUM TC 99M TETROFOSMIN IV KIT
29.7000 | PACK | Freq: Once | INTRAVENOUS | Status: AC | PRN
Start: 2023-01-04 — End: 2023-01-04
  Administered 2023-01-04: 29.7 via INTRAVENOUS

## 2023-01-04 NOTE — Addendum Note (Signed)
Addended by: Huntley Dec REDDY on: 01/04/2023 05:37 PM   Modules accepted: Orders

## 2023-01-05 ENCOUNTER — Encounter: Payer: Self-pay | Admitting: Physician Assistant

## 2023-01-22 ENCOUNTER — Ambulatory Visit: Payer: PPO | Admitting: Physician Assistant

## 2023-01-22 VITALS — BP 158/68 | HR 66 | Temp 97.1°F | Ht 64.0 in | Wt 130.0 lb

## 2023-01-22 DIAGNOSIS — F411 Generalized anxiety disorder: Secondary | ICD-10-CM

## 2023-01-22 DIAGNOSIS — R0789 Other chest pain: Secondary | ICD-10-CM

## 2023-01-22 DIAGNOSIS — R030 Elevated blood-pressure reading, without diagnosis of hypertension: Secondary | ICD-10-CM | POA: Insufficient documentation

## 2023-01-22 HISTORY — DX: Elevated blood-pressure reading, without diagnosis of hypertension: R03.0

## 2023-01-22 HISTORY — DX: Generalized anxiety disorder: F41.1

## 2023-01-22 HISTORY — DX: Other chest pain: R07.89

## 2023-01-22 MED ORDER — ESCITALOPRAM OXALATE 10 MG PO TABS: 10.0000 mg | ORAL_TABLET | Freq: Every day | ORAL | 0 refills | Status: DC

## 2023-01-22 NOTE — Progress Notes (Signed)
 Subjective:  Patient ID: Kelly Horn, female    DOB: March 10, 1945  Age: 78 y.o. MRN: [id number]  Chief Complaint  Patient presents with   Chest pain- 1 month follow up    HPI Pt in today for follow up of chest pain - she had been having some chest pains with exertional dyspnea and has seen cardiology - she had a nuclear stress test which was normal She states she has not had further episodes since last visit and wonders if perhaps some of her discomfort was stemmed from anxiety She is noted to have elevated bp today despite not having history of hypertension She is still taking asa 81mg  qd and metoprolol  XL 25mg  qd She denies any chest pain or dyspnea today  Pt states that she has issues with daily anxiety and worry - mostly worrying about her family.  Feels anxious and on edge a lot.  Does feel perhaps medication would benefit her     12/20/2022    9:49 AM 06/15/2022    1:41 PM 06/06/2021    7:34 AM 02/11/2021    8:13 AM 12/17/2019    3:17 PM  Depression screen PHQ 2/9  Decreased Interest 2 0 1 0 0  Down, Depressed, Hopeless 1 1 1  0 1  PHQ - 2 Score 3 1 2  0 1  Altered sleeping 0  0    Tired, decreased energy 1  1    Change in appetite 2  0    Feeling bad or failure about yourself  0  0    Trouble concentrating 0  0    Moving slowly or fidgety/restless 0  0    Suicidal thoughts 0  0    PHQ-9 Score 6  3    Difficult doing work/chores Not difficult at all  Not difficult at all          11/13/2019   11:06 AM 12/17/2019    3:16 PM 02/11/2021    8:13 AM 06/15/2022    1:41 PM 12/20/2022    9:48 AM  Fall Risk  Falls in the past year? 0 0 0 0 0  Was there an injury with Fall? 0 0 0 0 0  Fall Risk Category Calculator 0 0 0 0 0  Fall Risk Category (Retired) Low Low Low    (RETIRED) Patient Fall Risk Level Low fall risk Low fall risk Low fall risk    Patient at Risk for Falls Due to   No Fall Risks No Fall Risks No Fall Risks  Fall risk Follow up Falls evaluation completed  Falls  evaluation completed Falls evaluation completed Falls evaluation completed     ROS CONSTITUTIONAL: Negative for chills, fatigue, fever,  CARDIOVASCULAR: Negative for chest pain, dizziness, palpitations and pedal edema.  RESPIRATORY: Negative for recent cough and dyspnea.   PSYCHIATRIC: see HPI   Current Outpatient Medications:    aspirin  EC 81 MG tablet, Take 1 tablet (81 mg total) by mouth daily. Swallow whole., Disp: , Rfl:    Calcium Carbonate-Vit D-Min (CALCIUM 1200 PO), Take 1,200 mg by mouth daily., Disp: , Rfl:    cetirizine (ZYRTEC) 10 MG tablet, Take 10 mg by mouth daily., Disp: , Rfl:    escitalopram  (LEXAPRO ) 10 MG tablet, Take 1 tablet (10 mg total) by mouth daily., Disp: 30 tablet, Rfl: 0   finasteride (PROSCAR) 5 MG tablet, Take 5 mg by mouth daily., Disp: , Rfl:    fluticasone  (FLONASE ) 50 MCG/ACT nasal spray, INAHEL  2 SPRAYS INTO BOTH NOSTRILS DAILY., Disp: 16 g, Rfl: 5   levothyroxine  (SYNTHROID ) 50 MCG tablet, TAKE 1 TABLET BY MOUTH DAILY, Disp: 30 tablet, Rfl: 0   metoprolol  succinate (TOPROL -XL) 25 MG 24 hr tablet, Take 1 tablet (25 mg total) by mouth daily., Disp: 30 tablet, Rfl: 1   Multiple Vitamin (MULTI VITAMIN DAILY PO), Take 1 tablet by mouth daily., Disp: , Rfl:    omeprazole  (PRILOSEC) 20 MG capsule, Take 20 mg by mouth daily., Disp: , Rfl:    simvastatin  (ZOCOR ) 20 MG tablet, TAKE 1 TABLET BY MOUTH DAILY, Disp: 90 tablet, Rfl: 0  Past Medical History:  Diagnosis Date   Age-related osteoporosis without current pathological fracture    GERD with esophagitis    Mixed hyperlipidemia    Other specified hypothyroidism    Type 2 diabetes mellitus with other specified complication (HCC)    Objective:  PHYSICAL EXAM:   BP (!) 158/68   Pulse 66   Temp (!) 97.1 F (36.2 C) (Temporal)   Ht 5' 4 (1.626 m)   Wt 130 lb (59 kg)   SpO2 94%   BMI 22.31 kg/m    GEN: Well nourished, well developed, in no acute distress  Cardiac: RRR; no murmurs, rubs,   Respiratory:  normal respiratory rate and pattern with no distress - normal breath sounds with no rales, rhonchi, wheezes or rubs  Psych: euthymic mood, appropriate affect and demeanor  Assessment & Plan:    Other chest pain Resolved Follow up with cardiology as directed  GAD (generalized anxiety disorder) -     Escitalopram  Oxalate; Take 1 tablet (10 mg total) by mouth daily.  Dispense: 30 tablet; Refill: 0  Elevated BP without diagnosis of hypertension Recommend DASH diet Stay on metoprolol  as directed Monitor bp at home and come in for bp check in one month    Follow-up: Return in about 3 months (around 04/22/2023) for chronic fasting follow-up- bp check one month.  An After Visit Summary was printed and given to the patient.  Kelly Horn [facility] [phone]

## 2023-01-30 ENCOUNTER — Other Ambulatory Visit: Payer: Self-pay | Admitting: Physician Assistant

## 2023-01-30 DIAGNOSIS — E039 Hypothyroidism, unspecified: Secondary | ICD-10-CM

## 2023-02-20 ENCOUNTER — Other Ambulatory Visit: Payer: Self-pay | Admitting: Physician Assistant

## 2023-02-20 DIAGNOSIS — R0789 Other chest pain: Secondary | ICD-10-CM

## 2023-02-22 ENCOUNTER — Ambulatory Visit: Payer: PPO | Admitting: Physician Assistant

## 2023-02-25 ENCOUNTER — Ambulatory Visit (HOSPITAL_BASED_OUTPATIENT_CLINIC_OR_DEPARTMENT_OTHER): Admission: EM | Admit: 2023-02-25 | Discharge: 2023-02-25 | Discharge status: Against Medical Advice | Payer: PPO

## 2023-02-26 ENCOUNTER — Other Ambulatory Visit: Payer: Self-pay | Admitting: Physician Assistant

## 2023-02-26 DIAGNOSIS — F411 Generalized anxiety disorder: Secondary | ICD-10-CM

## 2023-02-28 ENCOUNTER — Encounter: Payer: Self-pay | Admitting: Physician Assistant

## 2023-02-28 ENCOUNTER — Ambulatory Visit (INDEPENDENT_AMBULATORY_CARE_PROVIDER_SITE_OTHER): Payer: PPO | Admitting: Physician Assistant

## 2023-02-28 VITALS — BP 126/70 | HR 61 | Temp 97.3°F | Resp 18 | Ht 64.0 in | Wt 132.2 lb

## 2023-02-28 DIAGNOSIS — J060 Acute laryngopharyngitis: Secondary | ICD-10-CM

## 2023-02-28 MED ORDER — AMOXICILLIN 875 MG PO TABS: 875.0000 mg | ORAL_TABLET | Freq: Two times a day (BID) | ORAL | 0 refills | Status: AC

## 2023-02-28 NOTE — Progress Notes (Signed)
Subjective:  Patient ID: Kelly Horn, female    DOB: 1945/03/29  Age: 78 y.o. MRN: [id number]  Chief Complaint  Patient presents with   Hypertension    HPI Pt in today for bp recheck.  BP today good at 126/70.  She denies chest pain or dyspnea Continues taking toprol XL 25mg  qd - did see cardiology and had a negative stress test recently  Pt complains of mild cough, congestion and left ear pain Denies fever - has been using decongestants at home     02/28/2023   11:19 AM 12/20/2022    9:49 AM 06/15/2022    1:41 PM 06/06/2021    7:34 AM 02/11/2021    8:13 AM  Depression screen PHQ 2/9  Decreased Interest 1 2 0 1 0  Down, Depressed, Hopeless 1 1 1 1  0  PHQ - 2 Score 2 3 1 2  0  Altered sleeping 0 0  0   Tired, decreased energy 1 1  1    Change in appetite 0 2  0   Feeling bad or failure about yourself  0 0  0   Trouble concentrating 0 0  0   Moving slowly or fidgety/restless 0 0  0   Suicidal thoughts 0 0  0   PHQ-9 Score 3 6  3    Difficult doing work/chores Not difficult at all Not difficult at all  Not difficult at all         12/17/2019    3:16 PM 02/11/2021    8:13 AM 06/15/2022    1:41 PM 12/20/2022    9:48 AM 02/28/2023   11:19 AM  Fall Risk  Falls in the past year? 0 0 0 0 0  Was there an injury with Fall? 0 0 0 0 0  Fall Risk Category Calculator 0 0 0 0 0  Fall Risk Category (Retired) Low Low     (RETIRED) Patient Fall Risk Level Low fall risk Low fall risk     Patient at Risk for Falls Due to  No Fall Risks No Fall Risks No Fall Risks No Fall Risks  Fall risk Follow up  Falls evaluation completed Falls evaluation completed Falls evaluation completed Falls evaluation completed     ROS CONSTITUTIONAL: Negative for chills, fatigue, fever, E/N/T: see HPI CARDIOVASCULAR: Negative for chest pain, dizziness, palpitations and pedal edema.  RESPIRATORY: see HPI GASTROINTESTINAL: Negative for abdominal pain, acid reflux symptoms, constipation, diarrhea, nausea and  vomiting.     Current Outpatient Medications:    amoxicillin (AMOXIL) 875 MG tablet, Take 1 tablet (875 mg total) by mouth 2 (two) times daily for 10 days., Disp: 20 tablet, Rfl: 0   aspirin EC 81 MG tablet, Take 1 tablet (81 mg total) by mouth daily. Swallow whole., Disp: , Rfl:    Calcium Carbonate-Vit D-Min (CALCIUM 1200 PO), Take 1,200 mg by mouth daily., Disp: , Rfl:    cetirizine (ZYRTEC) 10 MG tablet, Take 10 mg by mouth daily., Disp: , Rfl:    escitalopram (LEXAPRO) 10 MG tablet, TAKE 1 TABLET BY MOUTH DAILY., Disp: 30 tablet, Rfl: 2   finasteride (PROSCAR) 5 MG tablet, Take 5 mg by mouth daily., Disp: , Rfl:    fluticasone (FLONASE) 50 MCG/ACT nasal spray, INAHEL 2 SPRAYS INTO BOTH NOSTRILS DAILY., Disp: 16 g, Rfl: 5   levothyroxine (SYNTHROID) 50 MCG tablet, TAKE 1 TABLET BY MOUTH DAILY, Disp: 90 tablet, Rfl: 1   metoprolol succinate (TOPROL-XL) 25 MG 24 hr tablet, TAKE  1 TABLET BY MOUTH DAILY., Disp: 30 tablet, Rfl: 0   Multiple Vitamin (MULTI VITAMIN DAILY PO), Take 1 tablet by mouth daily., Disp: , Rfl:    omeprazole (PRILOSEC) 20 MG capsule, Take 20 mg by mouth daily., Disp: , Rfl:    simvastatin (ZOCOR) 20 MG tablet, TAKE 1 TABLET BY MOUTH DAILY, Disp: 90 tablet, Rfl: 0  Past Medical History:  Diagnosis Date   Age-related osteoporosis without current pathological fracture    GERD with esophagitis    Mixed hyperlipidemia    Other specified hypothyroidism    Type 2 diabetes mellitus with other specified complication (HCC)    Objective:  PHYSICAL EXAM:   BP 126/70 (BP Location: Left Arm, Patient Position: Sitting, Cuff Size: Normal)   Pulse 61   Temp (!) 97.3 F (36.3 C) (Temporal)   Resp 18   Ht 5\' 4"  (1.626 m)   Wt 132 lb 3.2 oz (60 kg)   SpO2 100%   BMI 22.69 kg/m    GEN: Well nourished, well developed, in no acute distress  HEENT: normal external ears and nose -right TM retracted and dull - left TM normal - Lips, Teeth and Gums - normal  Oropharynx - mild  erythema  Cardiac: RRR; no murmurs,  Respiratory:  normal respiratory rate and pattern with no distress - normal breath sounds with no rales, rhonchi, wheezes or rubs   Assessment & Plan:    Acute laryngopharyngitis -     Amoxicillin; Take 1 tablet (875 mg total) by mouth 2 (two) times daily for 10 days.  Dispense: 20 tablet; Refill: 0     Follow-up: Return in about 4 months (around 06/28/2023) for chronic fasting follow-up.  An After Visit Summary was printed and given to the patient.  Jettie Pagan [facility] [phone]

## 2023-03-01 DIAGNOSIS — R490 Dysphonia: Secondary | ICD-10-CM | POA: Diagnosis not present

## 2023-03-01 DIAGNOSIS — A048 Other specified bacterial intestinal infections: Secondary | ICD-10-CM | POA: Diagnosis not present

## 2023-03-01 DIAGNOSIS — K219 Gastro-esophageal reflux disease without esophagitis: Secondary | ICD-10-CM | POA: Diagnosis not present

## 2023-03-01 DIAGNOSIS — R1310 Dysphagia, unspecified: Secondary | ICD-10-CM | POA: Diagnosis not present

## 2023-03-14 DIAGNOSIS — R490 Dysphonia: Secondary | ICD-10-CM | POA: Diagnosis not present

## 2023-03-14 DIAGNOSIS — R1310 Dysphagia, unspecified: Secondary | ICD-10-CM | POA: Diagnosis not present

## 2023-03-20 ENCOUNTER — Other Ambulatory Visit: Payer: Self-pay | Admitting: Physician Assistant

## 2023-03-20 DIAGNOSIS — R0789 Other chest pain: Secondary | ICD-10-CM

## 2023-03-26 ENCOUNTER — Other Ambulatory Visit: Payer: Self-pay | Admitting: Physician Assistant

## 2023-03-26 DIAGNOSIS — Z1231 Encounter for screening mammogram for malignant neoplasm of breast: Secondary | ICD-10-CM

## 2023-03-27 ENCOUNTER — Ambulatory Visit
Admission: RE | Admit: 2023-03-27 | Discharge: 2023-03-27 | Disposition: A | Discharge status: Home / Self Care | Source: Ambulatory Visit | Attending: Physician Assistant | Admitting: Physician Assistant

## 2023-03-27 DIAGNOSIS — Z1231 Encounter for screening mammogram for malignant neoplasm of breast: Secondary | ICD-10-CM

## 2023-04-13 ENCOUNTER — Other Ambulatory Visit: Payer: Self-pay | Admitting: Physician Assistant

## 2023-04-13 DIAGNOSIS — E782 Mixed hyperlipidemia: Secondary | ICD-10-CM

## 2023-04-24 ENCOUNTER — Ambulatory Visit (INDEPENDENT_AMBULATORY_CARE_PROVIDER_SITE_OTHER): Payer: PPO | Admitting: Physician Assistant

## 2023-04-24 ENCOUNTER — Encounter: Payer: Self-pay | Admitting: Physician Assistant

## 2023-04-24 VITALS — BP 122/80 | HR 61 | Temp 98.1°F | Resp 18 | Ht 64.0 in | Wt 130.4 lb

## 2023-04-24 DIAGNOSIS — R7303 Prediabetes: Secondary | ICD-10-CM | POA: Diagnosis not present

## 2023-04-24 DIAGNOSIS — E039 Hypothyroidism, unspecified: Secondary | ICD-10-CM

## 2023-04-24 DIAGNOSIS — E782 Mixed hyperlipidemia: Secondary | ICD-10-CM | POA: Diagnosis not present

## 2023-04-24 DIAGNOSIS — K2100 Gastro-esophageal reflux disease with esophagitis, without bleeding: Secondary | ICD-10-CM

## 2023-04-24 DIAGNOSIS — F411 Generalized anxiety disorder: Secondary | ICD-10-CM

## 2023-04-24 DIAGNOSIS — M8588 Other specified disorders of bone density and structure, other site: Secondary | ICD-10-CM

## 2023-04-24 DIAGNOSIS — I10 Essential (primary) hypertension: Secondary | ICD-10-CM | POA: Diagnosis not present

## 2023-04-24 NOTE — Progress Notes (Signed)
 Subjective:  Patient ID: Kelly Horn, female    DOB: 1945/11/17  Age: 78 y.o. MRN: [id number]  Chief Complaint  Patient presents with   Medical Management of Chronic Issues   HPI  Mixed hyperlipidemia  Pt presents with hyperlipidemia. Compliance with treatment has been good The patient is compliant with medications, maintains a low cholesterol diet , follows up as directed , and maintains an exercise regimen . The patient denies experiencing any hypercholesterolemia related symptoms. Currently taking zocor 20mg  qd  Pt with history of GERD - stable on prilosec 20mg  qd  Pt with history of hypothyroidism - taking synthroid qd - voices no problems or concerns and is due for labwork  Pt with history of osteopenia - is on vit D and calcium supplements - is up to date with dexa scan  Pt with mild anxiety - has been on lexapro 10mg  qd for the past several months - states overall doing well on this medication   Pt with history of elevated bp several visits ago - she had been started on metoprolol 25mg  qd at that time also for atypical chest pain.  Her bp did normalize while on metoprolol and her symptoms cleared - probable new onset hypertension She denies further chest pain or edema Did have cardiology evaluation with normal nuclear stress test    04/24/2023    9:28 AM 02/28/2023   11:19 AM 12/20/2022    9:49 AM 06/15/2022    1:41 PM 06/06/2021    7:34 AM  Depression screen PHQ 2/9  Decreased Interest 1 1 2  0 1  Down, Depressed, Hopeless 1 1 1 1 1   PHQ - 2 Score 2 2 3 1 2   Altered sleeping 0 0 0  0  Tired, decreased energy 1 1 1  1   Change in appetite 0 0 2  0  Feeling bad or failure about yourself  0 0 0  0  Trouble concentrating 0 0 0  0  Moving slowly or fidgety/restless 0 0 0  0  Suicidal thoughts 0 0 0  0  PHQ-9 Score 3 3 6  3   Difficult doing work/chores  Not difficult at all Not difficult at all  Not difficult at all        02/11/2021    8:13 AM 06/15/2022    1:41 PM  12/20/2022    9:48 AM 02/28/2023   11:19 AM 04/24/2023    9:28 AM  Fall Risk  Falls in the past year? 0 0 0 0 0  Was there an injury with Fall? 0 0 0 0 0  Fall Risk Category Calculator 0 0 0 0 0  Fall Risk Category (Retired) Low      (RETIRED) Patient Fall Risk Level Low fall risk      Patient at Risk for Falls Due to No Fall Risks No Fall Risks No Fall Risks No Fall Risks No Fall Risks  Fall risk Follow up Falls evaluation completed Falls evaluation completed Falls evaluation completed Falls evaluation completed Falls evaluation completed     CONSTITUTIONAL: Negative for chills, fatigue, fever, unintentional weight gain and unintentional weight loss.  E/N/T: Negative for ear pain, nasal congestion and sore throat.  CARDIOVASCULAR: Negative for chest pain, dizziness, palpitations and pedal edema.  RESPIRATORY: Negative for recent cough and dyspnea.  GASTROINTESTINAL: Negative for abdominal pain, acid reflux symptoms, constipation, diarrhea, nausea and vomiting.  MSK: Negative for arthralgias and myalgias.  INTEGUMENTARY: Negative for rash.  NEUROLOGICAL: Negative for  dizziness and headaches.  PSYCHIATRIC: Negative for sleep disturbance and to question depression screen.  Negative for depression, negative for anhedonia.       Current Outpatient Medications:    Calcium Carbonate-Vit D-Min (CALCIUM 1200 PO), Take 1,200 mg by mouth daily., Disp: , Rfl:    cetirizine (ZYRTEC) 10 MG tablet, Take 10 mg by mouth daily., Disp: , Rfl:    escitalopram (LEXAPRO) 10 MG tablet, TAKE 1 TABLET BY MOUTH DAILY., Disp: 30 tablet, Rfl: 2   fluticasone (FLONASE) 50 MCG/ACT nasal spray, INAHEL 2 SPRAYS INTO BOTH NOSTRILS DAILY., Disp: 16 g, Rfl: 5   levothyroxine (SYNTHROID) 50 MCG tablet, TAKE 1 TABLET BY MOUTH DAILY, Disp: 90 tablet, Rfl: 1   metoprolol succinate (TOPROL-XL) 25 MG 24 hr tablet, TAKE 1 TABLET BY MOUTH DAILY., Disp: 30 tablet, Rfl: 5   Multiple Vitamin (MULTI VITAMIN DAILY PO), Take 1 tablet  by mouth daily., Disp: , Rfl:    omeprazole (PRILOSEC) 40 MG capsule, Take 40 mg by mouth daily., Disp: , Rfl:    simvastatin (ZOCOR) 20 MG tablet, TAKE 1 TABLET BY MOUTH DAILY, Disp: 90 tablet, Rfl: 0  Past Medical History:  Diagnosis Date   Age-related osteoporosis without current pathological fracture    GERD with esophagitis    Mixed hyperlipidemia    Other specified hypothyroidism    Type 2 diabetes mellitus with other specified complication (HCC)    Objective:  PHYSICAL EXAM:   VS: BP 122/80   Pulse 61   Temp 98.1 F (36.7 C) (Temporal)   Resp 18   Ht 5\' 4"  (1.626 m)   Wt 130 lb 6.4 oz (59.1 kg)   SpO2 98%   BMI 22.38 kg/m   GEN: Well nourished, well developed, in no acute distress   Cardiac: RRR; no murmurs, rubs, or gallops,no edema - Respiratory:  normal respiratory rate and pattern with no distress - normal breath sounds with no rales, rhonchi, wheezes or rubs  MS: no deformity or atrophy  Skin: warm and dry, no rash  Neuro:  Alert and Oriented x 3,  - CN II-Xii grossly intact Psych: euthymic mood, appropriate affect and demeanor   Assessment & Plan:    Prediabetes -     CBC with Differential/Platelet -     Comprehensive metabolic panel -     Hemoglobin A1c Watch diet Mixed hyperlipidemia -     Comprehensive metabolic panel -     Lipid panel Continue med Gastroesophageal reflux disease with esophagitis, unspecified whether hemorrhage Continue med Primary hypothyroidism -     TSH Continue med Vitamin D deficiency -     VITAMIN D 25 Hydroxy (Vit-D Deficiency, Fractures)  Osteopenia, unspecified location Continue calcium and vit D   Hypertension Stable on toprol XL 25mg  qd  Follow-up: Return in about 6 months (around 10/24/2023) for chronic fasting follow-up.  An After Visit Summary was printed and given to the patient.  Jettie Pagan [facility] [phone]

## 2023-04-25 ENCOUNTER — Encounter: Payer: Self-pay | Admitting: Family Medicine

## 2023-04-25 LAB — CBC WITH DIFFERENTIAL/PLATELET
Basophils Absolute: 0 10*3/uL (ref 0.0–0.2)
Basos: 1 %
EOS (ABSOLUTE): 0.1 10*3/uL (ref 0.0–0.4)
Eos: 2 %
Hematocrit: 42 % (ref 34.0–46.6)
Hemoglobin: 14.1 g/dL (ref 11.1–15.9)
Immature Grans (Abs): 0 10*3/uL (ref 0.0–0.1)
Immature Granulocytes: 0 %
Lymphocytes Absolute: 1.6 10*3/uL (ref 0.7–3.1)
Lymphs: 33 %
MCH: 31.1 pg (ref 26.6–33.0)
MCHC: 33.6 g/dL (ref 31.5–35.7)
MCV: 93 fL (ref 79–97)
Monocytes Absolute: 0.3 10*3/uL (ref 0.1–0.9)
Monocytes: 6 %
Neutrophils Absolute: 2.9 10*3/uL (ref 1.4–7.0)
Neutrophils: 58 %
Platelets: 205 10*3/uL (ref 150–450)
RBC: 4.53 x10E6/uL (ref 3.77–5.28)
RDW: 13 % (ref 11.7–15.4)
WBC: 4.9 10*3/uL (ref 3.4–10.8)

## 2023-04-25 LAB — LIPID PANEL
Chol/HDL Ratio: 2.6 ratio (ref 0.0–4.4)
Cholesterol, Total: 143 mg/dL (ref 100–199)
HDL: 56 mg/dL (ref >39)
LDL Chol Calc (NIH): 71 mg/dL (ref 0–99)
Triglycerides: 86 mg/dL (ref 0–149)
VLDL Cholesterol Cal: 16 mg/dL (ref 5–40)

## 2023-04-25 LAB — COMPREHENSIVE METABOLIC PANEL WITH GFR
ALT: 17 IU/L (ref 0–32)
AST: 20 IU/L (ref 0–40)
Albumin: 4.5 g/dL (ref 3.8–4.8)
Alkaline Phosphatase: 69 IU/L (ref 44–121)
BUN/Creatinine Ratio: 21 (ref 12–28)
BUN: 16 mg/dL (ref 8–27)
Bilirubin Total: 0.5 mg/dL (ref 0.0–1.2)
CO2: 24 mmol/L (ref 20–29)
Calcium: 9.3 mg/dL (ref 8.7–10.3)
Chloride: 105 mmol/L (ref 96–106)
Creatinine, Ser: 0.76 mg/dL (ref 0.57–1.00)
Globulin, Total: 2.4 g/dL (ref 1.5–4.5)
Glucose: 100 mg/dL — ABNORMAL HIGH (ref 70–99)
Potassium: 4.5 mmol/L (ref 3.5–5.2)
Sodium: 144 mmol/L (ref 134–144)
Total Protein: 6.9 g/dL (ref 6.0–8.5)
eGFR: 80 mL/min/{1.73_m2} (ref >59)

## 2023-04-25 LAB — HEMOGLOBIN A1C
Est. average glucose Bld gHb Est-mCnc: 131 mg/dL
Hgb A1c MFr Bld: 6.2 % — ABNORMAL HIGH (ref 4.8–5.6)

## 2023-04-25 LAB — TSH: TSH: 5.32 u[IU]/mL — ABNORMAL HIGH (ref 0.450–4.500)

## 2023-04-26 ENCOUNTER — Other Ambulatory Visit: Payer: Self-pay

## 2023-04-26 MED ORDER — LEVOTHYROXINE SODIUM 25 MCG PO TABS: 25.0000 ug | ORAL_TABLET | Freq: Every day | ORAL | 3 refills | Status: DC

## 2023-04-30 DIAGNOSIS — Z01419 Encounter for gynecological examination (general) (routine) without abnormal findings: Secondary | ICD-10-CM | POA: Diagnosis not present

## 2023-06-26 DIAGNOSIS — L570 Actinic keratosis: Secondary | ICD-10-CM | POA: Diagnosis not present

## 2023-06-26 DIAGNOSIS — L821 Other seborrheic keratosis: Secondary | ICD-10-CM | POA: Diagnosis not present

## 2023-06-26 DIAGNOSIS — L578 Other skin changes due to chronic exposure to nonionizing radiation: Secondary | ICD-10-CM | POA: Diagnosis not present

## 2023-07-03 ENCOUNTER — Other Ambulatory Visit: Payer: Self-pay | Admitting: Physician Assistant

## 2023-07-03 DIAGNOSIS — E782 Mixed hyperlipidemia: Secondary | ICD-10-CM

## 2023-07-04 ENCOUNTER — Telehealth: Payer: Self-pay

## 2023-07-04 DIAGNOSIS — H6503 Acute serous otitis media, bilateral: Secondary | ICD-10-CM | POA: Diagnosis not present

## 2023-07-04 DIAGNOSIS — R030 Elevated blood-pressure reading, without diagnosis of hypertension: Secondary | ICD-10-CM | POA: Diagnosis not present

## 2023-07-04 NOTE — Telephone Encounter (Signed)
 Yes can change manufacturer - call pharmacy please

## 2023-07-04 NOTE — Telephone Encounter (Signed)
 Pharmacy was notified.

## 2023-07-04 NOTE — Telephone Encounter (Signed)
 Copied from CRM [id number]. Topic: Clinical - Prescription Issue >> Jul 03, 2023  4:13 PM Tiffany S wrote: Reason for CRM: levothyroxine  (SYNTHROID ) 25 MCG tablet [[id number]] Pharmacy needs permission to change manufacture  for medication   [id number]

## 2023-07-23 ENCOUNTER — Encounter: Payer: Self-pay | Admitting: Physician Assistant

## 2023-07-23 ENCOUNTER — Other Ambulatory Visit: Payer: Self-pay | Admitting: Physician Assistant

## 2023-07-23 ENCOUNTER — Ambulatory Visit (INDEPENDENT_AMBULATORY_CARE_PROVIDER_SITE_OTHER): Admitting: Physician Assistant

## 2023-07-23 VITALS — BP 120/78 | HR 87 | Temp 97.9°F | Ht 64.0 in | Wt 128.4 lb

## 2023-07-23 DIAGNOSIS — F411 Generalized anxiety disorder: Secondary | ICD-10-CM

## 2023-07-23 DIAGNOSIS — E559 Vitamin D deficiency, unspecified: Secondary | ICD-10-CM

## 2023-07-23 DIAGNOSIS — R42 Dizziness and giddiness: Secondary | ICD-10-CM | POA: Diagnosis not present

## 2023-07-23 DIAGNOSIS — M8588 Other specified disorders of bone density and structure, other site: Secondary | ICD-10-CM

## 2023-07-23 DIAGNOSIS — I10 Essential (primary) hypertension: Secondary | ICD-10-CM | POA: Diagnosis not present

## 2023-07-23 DIAGNOSIS — K2100 Gastro-esophageal reflux disease with esophagitis, without bleeding: Secondary | ICD-10-CM | POA: Diagnosis not present

## 2023-07-23 DIAGNOSIS — R55 Syncope and collapse: Secondary | ICD-10-CM | POA: Diagnosis not present

## 2023-07-23 DIAGNOSIS — R7303 Prediabetes: Secondary | ICD-10-CM

## 2023-07-23 DIAGNOSIS — E782 Mixed hyperlipidemia: Secondary | ICD-10-CM | POA: Diagnosis not present

## 2023-07-23 DIAGNOSIS — E039 Hypothyroidism, unspecified: Secondary | ICD-10-CM | POA: Diagnosis not present

## 2023-07-23 DIAGNOSIS — L659 Nonscarring hair loss, unspecified: Secondary | ICD-10-CM | POA: Insufficient documentation

## 2023-07-23 HISTORY — DX: Essential (primary) hypertension: I10

## 2023-07-23 HISTORY — DX: Dizziness and giddiness: R42

## 2023-07-23 HISTORY — DX: Nonscarring hair loss, unspecified: L65.9

## 2023-07-23 NOTE — Progress Notes (Signed)
 Subjective:  Patient ID: Kelly Horn, female    DOB: Jan 25, 1945  Age: 78 y.o. MRN: [id number]  Chief Complaint  Patient presents with   Medical Management of Chronic Issues   HPI  Mixed hyperlipidemia  Pt presents with hyperlipidemia. Compliance with treatment has been good The patient is compliant with medications, maintains a low cholesterol diet , follows up as directed , and maintains an exercise regimen . The patient denies experiencing any hypercholesterolemia related symptoms. Currently taking zocor  20mg  qd  Pt with history of GERD - stable on prilosec 40mg  qd  Pt with history of hypothyroidism - taking synthroid  25 mcg qd - voices no problems or concerns and is due for labwork  Pt with history of osteopenia - is on vit D and calcium supplements - is up to date with dexa scan  Pt with mild anxiety - has been on lexapro  10mg  qd for the past several months - states overall doing well on this medication   Pt with history of elevated bp several visits ago - she had been started on metoprolol  25mg  qd at that time also for atypical chest pain.  Her bp did normalize while on metoprolol  and her symptoms cleared - bp has been stable since starting metoprolol  25mg  qd She denies further chest pain however she states for the past 2-3 months she has been having intermittent episodes of dizziness - can happen at any time and loses her balance - the spells only last about 15 seconds and then afterward feels ok Says in the past 2 weeks symptoms have worsened (but did see urgent care for serous om recently as well) Did have cardiology evaluation with normal nuclear stress test in 12/24  Pt has seen dermatology for hair loss - is treated with proscar    07/23/2023   10:35 AM 04/24/2023    9:28 AM 02/28/2023   11:19 AM 12/20/2022    9:49 AM 06/15/2022    1:41 PM  Depression screen PHQ 2/9  Decreased Interest 0 1 1 2  0  Down, Depressed, Hopeless 0 1 1 1 1   PHQ - 2 Score 0 2 2 3 1   Altered  sleeping  0 0 0   Tired, decreased energy  1 1 1    Change in appetite  0 0 2   Feeling bad or failure about yourself   0 0 0   Trouble concentrating  0 0 0   Moving slowly or fidgety/restless  0 0 0   Suicidal thoughts  0 0 0   PHQ-9 Score  3 3 6    Difficult doing work/chores   Not difficult at all Not difficult at all         06/15/2022    1:41 PM 12/20/2022    9:48 AM 02/28/2023   11:19 AM 04/24/2023    9:28 AM 07/23/2023   10:35 AM  Fall Risk  Falls in the past year? 0 0 0 0 0  Was there an injury with Fall? 0 0 0 0 0  Fall Risk Category Calculator 0 0 0 0 0  Patient at Risk for Falls Due to No Fall Risks No Fall Risks No Fall Risks No Fall Risks No Fall Risks  Fall risk Follow up Falls evaluation completed Falls evaluation completed Falls evaluation completed Falls evaluation completed     CONSTITUTIONAL: Negative for chills, fatigue, fever, unintentional weight gain and unintentional weight loss.  E/N/T: see HPI CARDIOVASCULAR: Negative for chest pain, palpitations and pedal edema. -  has had dizziness intermittently for the past few months RESPIRATORY: Negative for recent cough and dyspnea.  GASTROINTESTINAL: Negative for abdominal pain, acid reflux symptoms, constipation, diarrhea, nausea and vomiting.  MSK: Negative for arthralgias and myalgias.  INTEGUMENTARY: Negative for rash.  PSYCHIATRIC: Negative for sleep disturbance and to question depression screen.  Negative for depression, negative for anhedonia.         Current Outpatient Medications:    Calcium Carbonate-Vit D-Min (CALCIUM 1200 PO), Take 1,200 mg by mouth daily., Disp: , Rfl:    cetirizine (ZYRTEC) 10 MG tablet, Take 10 mg by mouth daily., Disp: , Rfl:    finasteride (PROSCAR) 5 MG tablet, Take 5 mg by mouth daily., Disp: , Rfl:    fluticasone  (FLONASE ) 50 MCG/ACT nasal spray, INAHEL 2 SPRAYS INTO BOTH NOSTRILS DAILY., Disp: 16 g, Rfl: 5   levothyroxine  (SYNTHROID ) 25 MCG tablet, Take 1 tablet (25 mcg total) by  mouth daily., Disp: 90 tablet, Rfl: 3   metoprolol  succinate (TOPROL -XL) 25 MG 24 hr tablet, TAKE 1 TABLET BY MOUTH DAILY., Disp: 30 tablet, Rfl: 5   montelukast (SINGULAIR) 10 MG tablet, Take 10 mg by mouth at bedtime. 10 days left as of 07/23/2023, Disp: , Rfl:    Multiple Vitamin (MULTI VITAMIN DAILY PO), Take 1 tablet by mouth daily., Disp: , Rfl:    omeprazole  (PRILOSEC) 40 MG capsule, Take 40 mg by mouth daily., Disp: , Rfl:    simvastatin  (ZOCOR ) 20 MG tablet, TAKE 1 TABLET BY MOUTH DAILY, Disp: 90 tablet, Rfl: 0   escitalopram  (LEXAPRO ) 10 MG tablet, TAKE 1 TABLET BY MOUTH DAILY. (Patient not taking: Reported on 07/23/2023), Disp: 30 tablet, Rfl: 2  Past Medical History:  Diagnosis Date   Age-related osteoporosis without current pathological fracture    GERD with esophagitis    Mixed hyperlipidemia    Other specified hypothyroidism    Type 2 diabetes mellitus with other specified complication (HCC)    Objective:  PHYSICAL EXAM:   VS: BP 120/78   Pulse 87   Temp 97.9 F (36.6 C)   Ht 5' 4 (1.626 m)   Wt 128 lb 6.4 oz (58.2 kg)   SpO2 97%   BMI 22.04 kg/m   GEN: Well nourished, well developed, in no acute distress  HEENT: normal external ears and nose - normal external auditory canals and TMS - hearing grossly normal - - Lips, Teeth and Gums - normal  Oropharynx - normal mucosa, palate, and posterior pharynx Neck: no JVD or masses - no thyromegaly Cardiac: RRR; no murmurs, rubs, or gallops,no edema  Respiratory:  normal respiratory rate and pattern with no distress - normal breath sounds with no rales, rhonchi, wheezes or rubs  Skin: warm and dry, no rash  Neuro:  Alert and Oriented x 3, - CN II-Xii grossly intact Psych: euthymic mood, appropriate affect and demeanor  EKG normal  Assessment & Plan:    Prediabetes -     CBC with Differential/Platelet -     Comprehensive metabolic panel -     Hemoglobin A1c Watch diet Mixed hyperlipidemia -     Comprehensive  metabolic panel -     Lipid panel Continue med Gastroesophageal reflux disease with esophagitis, unspecified whether hemorrhage Continue med Primary hypothyroidism -     TSH Continue med Vitamin D  deficiency -     VITAMIN D  25 Hydroxy (Vit-D Deficiency, Fractures)  Osteopenia, unspecified location Continue calcium and vit D   Hypertension Stable on toprol  XL 25mg  qd  Syncope Labwork pending Will make follow up appt with cardiology - recommend event monitor  Hair loss - continue proscar  Osteopenia Continue calcium/vit D  Follow-up: Return in about 3 weeks (around 08/13/2023) for nurse visit BP check and in 3 months chronic fasting follow up.  An After Visit Summary was printed and given to the patient.  CAMIE JONELLE NICHOLAUS DEVONNA [facility] [phone]

## 2023-07-24 ENCOUNTER — Other Ambulatory Visit: Payer: Self-pay | Admitting: Physician Assistant

## 2023-07-24 ENCOUNTER — Ambulatory Visit: Payer: Self-pay | Admitting: Physician Assistant

## 2023-07-24 DIAGNOSIS — E039 Hypothyroidism, unspecified: Secondary | ICD-10-CM

## 2023-07-24 LAB — COMPREHENSIVE METABOLIC PANEL WITH GFR
ALT: 22 IU/L (ref 0–32)
AST: 26 IU/L (ref 0–40)
Albumin: 4.6 g/dL (ref 3.8–4.8)
Alkaline Phosphatase: 66 IU/L (ref 44–121)
BUN/Creatinine Ratio: 16 (ref 12–28)
BUN: 14 mg/dL (ref 8–27)
Bilirubin Total: 0.4 mg/dL (ref 0.0–1.2)
CO2: 23 mmol/L (ref 20–29)
Calcium: 9.5 mg/dL (ref 8.7–10.3)
Chloride: 105 mmol/L (ref 96–106)
Creatinine, Ser: 0.9 mg/dL (ref 0.57–1.00)
Globulin, Total: 2.3 g/dL (ref 1.5–4.5)
Glucose: 95 mg/dL (ref 70–99)
Potassium: 4.4 mmol/L (ref 3.5–5.2)
Sodium: 144 mmol/L (ref 134–144)
Total Protein: 6.9 g/dL (ref 6.0–8.5)
eGFR: 65 mL/min/1.73 (ref >59)

## 2023-07-24 LAB — LIPID PANEL
Chol/HDL Ratio: 3 ratio (ref 0.0–4.4)
Cholesterol, Total: 153 mg/dL (ref 100–199)
HDL: 51 mg/dL
LDL Chol Calc (NIH): 79 mg/dL (ref 0–99)
Triglycerides: 132 mg/dL (ref 0–149)
VLDL Cholesterol Cal: 23 mg/dL (ref 5–40)

## 2023-07-24 LAB — CBC WITH DIFFERENTIAL/PLATELET
Basophils Absolute: 0 x10E3/uL (ref 0.0–0.2)
Basos: 1 %
EOS (ABSOLUTE): 0.2 x10E3/uL (ref 0.0–0.4)
Eos: 3 %
Hematocrit: 43.8 % (ref 34.0–46.6)
Hemoglobin: 13.8 g/dL (ref 11.1–15.9)
Immature Grans (Abs): 0 x10E3/uL (ref 0.0–0.1)
Immature Granulocytes: 0 %
Lymphocytes Absolute: 2.2 x10E3/uL (ref 0.7–3.1)
Lymphs: 36 %
MCH: 30.3 pg (ref 26.6–33.0)
MCHC: 31.5 g/dL (ref 31.5–35.7)
MCV: 96 fL (ref 79–97)
Monocytes Absolute: 0.4 x10E3/uL (ref 0.1–0.9)
Monocytes: 6 %
Neutrophils Absolute: 3.3 x10E3/uL (ref 1.4–7.0)
Neutrophils: 54 %
Platelets: 239 x10E3/uL (ref 150–450)
RBC: 4.55 x10E6/uL (ref 3.77–5.28)
RDW: 12.9 % (ref 11.7–15.4)
WBC: 6.1 x10E3/uL (ref 3.4–10.8)

## 2023-07-24 LAB — VITAMIN D 25 HYDROXY (VIT D DEFICIENCY, FRACTURES): Vit D, 25-Hydroxy: 72 ng/mL (ref 30.0–100.0)

## 2023-07-24 LAB — TSH: TSH: 27 u[IU]/mL — ABNORMAL HIGH (ref 0.450–4.500)

## 2023-07-24 LAB — HEMOGLOBIN A1C
Est. average glucose Bld gHb Est-mCnc: 131 mg/dL
Hgb A1c MFr Bld: 6.2 % — ABNORMAL HIGH (ref 4.8–5.6)

## 2023-07-24 MED ORDER — LEVOTHYROXINE SODIUM 50 MCG PO TABS: 50.0000 ug | ORAL_TABLET | Freq: Every day | ORAL | 0 refills | Status: DC

## 2023-07-26 DIAGNOSIS — E038 Other specified hypothyroidism: Secondary | ICD-10-CM | POA: Insufficient documentation

## 2023-07-26 DIAGNOSIS — E1169 Type 2 diabetes mellitus with other specified complication: Secondary | ICD-10-CM | POA: Insufficient documentation

## 2023-07-26 NOTE — Progress Notes (Signed)
 Cardiology Office Note   Date:  07/27/2023  ID:  Kelly Horn, DOB May 10, 1945, MRN [id number] PCP: Nicholaus Credit, PA-C  [facility] Providers Cardiologist:  None     History of Present Illness Kelly Horn is a 78 y.o. female with a past medical history of hypertension, hypothyroidism, DM 2, dyslipidemia.  01/04/2023 Myoview  normal, low risk study  She established care with Dr. Liborio in December 2024 for evaluation of chest pain of uncertain origin.  She was dealing with an increase in personal stress related to caring for her husband, staying very physically active walking daily for exercise, did endorse a few episodes of chest discomfort that were fleeting in nature.  She ultimately underwent a Myoview  which was a normal, low risk study and she was advised she could follow-up on an as-needed basis.  She presents today for follow-up after near syncopal event.  History is somewhat hard to obtain, she is clear that she did not completely pass out, rather she was just very dizzy and felt like she might.  [place] infection.  She is also had her TSH checked which was elevated and her thyroid  supplementation has been adjusted.  She also questions why she is taking metoprolol , we reviewed her chart apparently this was started after she had an episode of chest pain that felt could be cardiac in nature however subsequent Myoview  was normal she would like to discontinue this. She denies chest pain,  dyspnea, pnd, orthopnea, n, v, dizziness, syncope, edema, weight gain, or early satiety.   ROS: Review of Systems  Constitutional:  Positive for malaise/fatigue.  Neurological:  Positive for dizziness.  All other systems reviewed and are negative.    Studies Reviewed      Cardiac Studies & Procedures   ______________________________________________________________________________________________   STRESS  TESTS  MYOCARDIAL PERFUSION IMAGING 01/04/2023  Interpretation Summary   The study is normal. The study is low risk.  Normal exercise capacity.   Left ventricular function is normal. Nuclear stress EF: 74%. The left ventricular ejection fraction is hyperdynamic (>65%). End diastolic cavity size is normal.   Prior study not available for comparison.            ______________________________________________________________________________________________      Risk Assessment/Calculations           Physical Exam VS:  BP 130/61 (BP Location: Right Arm, Patient Position: Standing, Cuff Size: Normal)   Pulse 65   Ht 5' 1 (1.549 m)   Wt 129 lb (58.5 kg)   SpO2 96%   BMI 24.37 kg/m        Wt Readings from Last 3 Encounters:  07/27/23 129 lb (58.5 kg)  07/23/23 128 lb 6.4 oz (58.2 kg)  04/24/23 130 lb 6.4 oz (59.1 kg)    GEN: Well nourished, well developed in no acute distress NECK: No JVD; No carotid bruits CARDIAC: RRR, no murmurs, rubs, gallops RESPIRATORY:  Clear to auscultation without rales, wheezing or rhonchi  ABDOMEN: Soft, non-tender, non-distended EXTREMITIES:  No edema; No deformity   ASSESSMENT AND PLAN Near syncope -near syncopal event as outlined above in the HPI, she is not overly concerned about this however it was suggested by her PCP she wear a monitor.  For completeness, we will arrange for a 7-day monitor to assess for any arrhythmias.  Dizziness-likely secondary to her inner ear infection.  She is staying well-hydrated.  She would like to  get off her metoprolol  and question why she has started on it, it appears it was initially started after an episode of chest pain and was continued for blood pressure control.  We will wean her off of this and discontinue in case this is contributing to her dizziness.  If her blood pressure can continues to be of concern, we will consider starting amlodipine.  Hypothyroidism-most recent TSH elevated at 27, monitored by  PCP.  Dyslipidemia-most recent LDL was controlled at 79, there is no known coronary artery disease so this is acceptable, continue Zocor  20 mg daily.  Palpitations - monitor per above.          Dispo: 7-day monitor, follow-up in 6 months with Dr. Liborio.  Signed, Delon JAYSON Hoover, NP

## 2023-07-27 ENCOUNTER — Ambulatory Visit: Attending: Cardiology

## 2023-07-27 ENCOUNTER — Encounter: Payer: Self-pay | Admitting: Cardiology

## 2023-07-27 ENCOUNTER — Ambulatory Visit: Attending: Cardiology | Admitting: Cardiology

## 2023-07-27 VITALS — BP 130/61 | HR 65 | Ht 61.0 in | Wt 129.0 lb

## 2023-07-27 DIAGNOSIS — I10 Essential (primary) hypertension: Secondary | ICD-10-CM

## 2023-07-27 DIAGNOSIS — R002 Palpitations: Secondary | ICD-10-CM

## 2023-07-27 DIAGNOSIS — E782 Mixed hyperlipidemia: Secondary | ICD-10-CM | POA: Diagnosis not present

## 2023-07-27 DIAGNOSIS — R42 Dizziness and giddiness: Secondary | ICD-10-CM

## 2023-07-27 DIAGNOSIS — R55 Syncope and collapse: Secondary | ICD-10-CM

## 2023-07-27 MED ORDER — MONTELUKAST SODIUM 10 MG PO TABS: 10.0000 mg | ORAL_TABLET | Freq: Every day | ORAL | 1 refills | Status: DC

## 2023-07-27 NOTE — Patient Instructions (Addendum)
 Medication Instructions:  Regarding your metoprolol   Next week: take M, W, F The following week: take Tu, Th Third week: STOP!!!   *If you need a refill on your cardiac medications before your next appointment, please call your pharmacy*   Lab Work: None Ordered If you have labs (blood work) drawn today and your tests are completely normal, you will receive your results only by: MyChart Message (if you have MyChart) OR A paper copy in the mail If you have any lab test that is abnormal or we need to change your treatment, we will call you to review the results.   Testing/Procedures:  WHY IS MY DOCTOR PRESCRIBING ZIO? The Zio system is proven and trusted by physicians to detect and diagnose irregular heart rhythms -- and has been prescribed to hundreds of thousands of patients.  The FDA has cleared the Zio system to monitor for many different kinds of irregular heart rhythms. In a study, physicians were able to reach a diagnosis 90% of the time with the Zio system1.  You can wear the Zio monitor -- a small, discreet, comfortable patch -- during your normal day-to-day activity, including while you sleep, shower, and exercise, while it records every single heartbeat for analysis.  1Barrett, P., et al. Comparison of 24 Hour Holter Monitoring Versus 14 Day Novel Adhesive Patch Electrocardiographic Monitoring. American Journal of Medicine, 2014.  ZIO VS. HOLTER MONITORING The Zio monitor can be comfortably worn for up to 14 days. Holter monitors can be worn for 24 to 48 hours, limiting the time to record any irregular heart rhythms you may have. Zio is able to capture data for the 51% of patients who have their first symptom-triggered arrhythmia after 48 hours.1  LIVE WITHOUT RESTRICTIONS The Zio ambulatory cardiac monitor is a small, unobtrusive, and water-resistant patch--you might even forget you're wearing it. The Zio monitor records and stores every beat of your heart, whether you're  sleeping, working out, or showering.     Follow-Up: At [facility], you and your health needs are our priority.  As part of our continuing mission to provide you with exceptional heart care, we have created designated Provider Care Teams.  These Care Teams include your primary Cardiologist (physician) and Advanced Practice Providers (APPs -  Physician Assistants and Nurse Practitioners) who all work together to provide you with the care you need, when you need it.  We recommend signing up for the patient portal called MyChart.  Sign up information is provided on this After Visit Summary.  MyChart is used to connect with patients for Virtual Visits (Telemedicine).  Patients are able to view lab/test results, encounter notes, upcoming appointments, etc.  Non-urgent messages can be sent to your provider as well.   To learn more about what you can do with MyChart, go to [email or web address].    Your next appointment:   6 month(s) with Dr. Liborio  The format for your next appointment:   In Person  Provider:   Delon Hoover, NP   Other Instructions NA

## 2023-08-07 DIAGNOSIS — R002 Palpitations: Secondary | ICD-10-CM | POA: Diagnosis not present

## 2023-08-11 ENCOUNTER — Other Ambulatory Visit: Payer: Self-pay | Admitting: Physician Assistant

## 2023-08-11 DIAGNOSIS — E782 Mixed hyperlipidemia: Secondary | ICD-10-CM

## 2023-08-13 ENCOUNTER — Ambulatory Visit

## 2023-08-13 VITALS — BP 130/68 | HR 73

## 2023-08-13 DIAGNOSIS — I10 Essential (primary) hypertension: Secondary | ICD-10-CM

## 2023-08-13 NOTE — Progress Notes (Signed)
 Patient is in office today for a nurse visit for Blood Pressure Check. Patient blood pressure was 130/68, Patient No chest pain, No shortness of breath, No dyspnea on exertion, No orthopnea, No paroxysmal nocturnal dyspnea, No edema, No palpitations, No syncope  Per Camie Moats, PA-C: BP looks good patient should follow up as scheduled.

## 2023-08-21 ENCOUNTER — Other Ambulatory Visit

## 2023-08-23 ENCOUNTER — Other Ambulatory Visit

## 2023-08-23 DIAGNOSIS — E039 Hypothyroidism, unspecified: Secondary | ICD-10-CM

## 2023-08-24 ENCOUNTER — Ambulatory Visit: Payer: Self-pay | Admitting: Physician Assistant

## 2023-08-24 ENCOUNTER — Other Ambulatory Visit: Payer: Self-pay | Admitting: Physician Assistant

## 2023-08-24 DIAGNOSIS — E039 Hypothyroidism, unspecified: Secondary | ICD-10-CM

## 2023-08-24 LAB — TSH: TSH: 11.8 u[IU]/mL — ABNORMAL HIGH (ref 0.450–4.500)

## 2023-08-24 MED ORDER — LEVOTHYROXINE SODIUM 75 MCG PO TABS: 75.0000 ug | ORAL_TABLET | Freq: Every day | ORAL | 0 refills | Status: DC

## 2023-09-10 ENCOUNTER — Ambulatory Visit (INDEPENDENT_AMBULATORY_CARE_PROVIDER_SITE_OTHER): Admitting: Family Medicine

## 2023-09-10 ENCOUNTER — Ambulatory Visit: Payer: Self-pay

## 2023-09-10 ENCOUNTER — Encounter: Payer: Self-pay | Admitting: Family Medicine

## 2023-09-10 VITALS — BP 130/72 | HR 70 | Temp 97.8°F | Resp 16 | Ht 61.0 in | Wt 130.0 lb

## 2023-09-10 DIAGNOSIS — J3089 Other allergic rhinitis: Secondary | ICD-10-CM | POA: Insufficient documentation

## 2023-09-10 DIAGNOSIS — J0110 Acute frontal sinusitis, unspecified: Secondary | ICD-10-CM

## 2023-09-10 MED ORDER — AMOXICILLIN 875 MG PO TABS: 875.0000 mg | ORAL_TABLET | Freq: Two times a day (BID) | ORAL | 0 refills | Status: AC

## 2023-09-10 NOTE — Assessment & Plan Note (Addendum)
 Acute [place] Sinus symptoms persist despite Flonase . Montelukast  may contribute to some of her symptoms. - Amoxicillin  875 mg by mouth TWICE A DAY for 7 days - Discontinue montelukast  (Singulair ). - Consider over-the-counter antihistamine such as cetirizine (Zyrtec). - Increase fluid intake, rest and practice good hand hygeine - Throw away your toothbrush and start using a new one  Follow-up if symptoms persist or do not improve over the next 7 days, possible referral to ENT for evaluation

## 2023-09-10 NOTE — Assessment & Plan Note (Signed)
 Allergic rhinitis Persistent symptoms not controlled with Flonase . Possible eustachian tube dysfunction. - Discontinue montelukast  (Singulair ). - Consider over-the-counter antihistamine such as cetirizine (Zyrtec).

## 2023-09-10 NOTE — Progress Notes (Signed)
 Acute Office Visit  Subjective:    Patient ID: Kelly Horn, female    DOB: [birth year], 78 y.o.   MRN: [id number]  Chief Complaint  Patient presents with   Ear Pain   Sore Throat    Discussed the use of AI scribe software for clinical note transcription with the patient, who gave verbal consent to proceed.  History of Present Illness   Kelly Horn is a 78 year old female who presents with persistent ear discomfort and hoarseness.  She has experienced bilateral ear discomfort [place]. The discomfort is described as 'popping' and 'cracking,' with the right ear being more affected. She notes wax buildup and recalls being told there was fluid behind the eardrums by another provider. No fever or severe pain is present, but she occasionally hears cracking in her ears.  She has hoarseness with variable severity, with some days better than others. She also has dysphagia, describing it as a sensation of something 'needing to get right here and not go down.' No flu-like symptoms, sore throat, or significant cough, though she mentions a little coughing. She reports no sinus pain and only occasional headaches.  She has been using Flonase , two sprays in each nostril daily since June 2025, and is on her second bottle of an allergy medication. Despite these treatments, her symptoms persist. She also experiences tremors and sleep disturbances, which she attributes to her current medication regimen.  No history of rash or anxiety. She has not had any yeast infections with antibiotics in the past. She generally prefers to avoid medications when possible.  She engages in regular physical activity, as she is a [employer].       Past Medical History:  Diagnosis Date   Abnormal glucose level 06/15/2022   Acquired hypothyroidism 12/20/2022   Acute sinusitis 11/13/2019   Atypical chest pain 12/20/2022   BMI 24.0-24.9, adult 06/03/2019   Dizziness 07/23/2023    Elevated BP without diagnosis of hypertension 01/22/2023   Exertional dyspnea 12/20/2022   GAD (generalized anxiety disorder) 01/22/2023   GERD with esophagitis    Hair loss 07/23/2023   Medicare annual wellness visit, subsequent 12/17/2019   Menopause present 06/03/2019   Mixed hyperlipidemia    Osteopenia 12/20/2022   Other chest pain 01/22/2023   Other fatigue 12/20/2022   Other specified hypothyroidism    Prediabetes 10/08/2020   Primary hypertension 07/23/2023   Primary hypothyroidism    Type 2 diabetes mellitus with other specified complication (HCC)    Vitamin D  deficiency 12/20/2022    Past Surgical History:  Procedure Laterality Date   CERVICAL DISC SURGERY     PARTIAL HYSTERECTOMY      Family History  Problem Relation Age of Onset   Cancer Mother    Heart attack Father    Diabetes Other    Arthritis Other     Social History   Socioeconomic History   Marital status: Married    Spouse name: Not on file   Number of children: 2   Years of education: Not on file   Highest education level: 12th grade  Occupational History   Occupation: retired  Tobacco Use   Smoking status: Never   Smokeless tobacco: Never  Vaping Use   Vaping status: Never Used  Substance and Sexual Activity   Alcohol use: Never   Drug use: Never   Sexual activity: Not on file  Other Topics Concern   Not on file  Social History Narrative   Not on file   Social Drivers of Health   Financial Resource Strain: Low Risk  (07/22/2023)   Overall Financial Resource Strain (CARDIA)    Difficulty of Paying Living Expenses: Not hard at all  Food Insecurity: No Food Insecurity (07/22/2023)   Hunger Vital Sign    Worried About Running Out of Food in the Last Year: Never true    Ran Out of Food in the Last Year: Never true  Transportation Needs: No Transportation Needs (07/22/2023)   PRAPARE - Administrator, Civil Service (Medical): No    Lack of Transportation (Non-Medical): No   Physical Activity: Sufficiently Active (07/22/2023)   Exercise Vital Sign    Days of Exercise per Week: 7 days    Minutes of Exercise per Session: 30 min  Stress: No Stress Concern Present (07/22/2023)   [name] of Occupational Health - Occupational Stress Questionnaire    Feeling of Stress: Not at all  Social Connections: Socially Integrated (07/22/2023)   Social Connection and Isolation Panel    Frequency of Communication with Friends and Family: More than three times a week    Frequency of Social Gatherings with Friends and Family: More than three times a week    Attends Religious Services: More than 4 times per year    Active Member of [employer] or Organizations: Yes    Attends Engineer, structural: More than 4 times per year    Marital Status: Married  Catering manager Violence: Not At Risk (06/15/2022)   Humiliation, Afraid, Rape, and Kick questionnaire    Fear of Current or Ex-Partner: No    Emotionally Abused: No    Physically Abused: No    Sexually Abused: No    Outpatient Medications Prior to Visit  Medication Sig Dispense Refill   Calcium Carbonate-Vit D-Min (CALCIUM 1200 PO) Take 1,200 mg by mouth daily.     cetirizine (ZYRTEC) 10 MG tablet Take 10 mg by mouth daily.     fluticasone  (FLONASE ) 50 MCG/ACT nasal spray INAHEL 2 SPRAYS INTO BOTH NOSTRILS DAILY. 16 g 5   levothyroxine  (SYNTHROID ) 75 MCG tablet Take 1 tablet (75 mcg total) by mouth daily. 90 tablet 0   montelukast  (SINGULAIR ) 10 MG tablet Take 1 tablet (10 mg total) by mouth at bedtime. 10 days left as of 07/23/2023 30 tablet 1   Multiple Vitamin (MULTI VITAMIN DAILY PO) Take 1 tablet by mouth daily.     omeprazole  (PRILOSEC) 40 MG capsule Take 40 mg by mouth daily.     simvastatin  (ZOCOR ) 20 MG tablet TAKE 1 TABLET BY MOUTH DAILY 90 tablet 0   escitalopram  (LEXAPRO ) 10 MG tablet TAKE 1 TABLET BY MOUTH DAILY. (Patient not taking: Reported on 09/10/2023) 30 tablet 5   metoprolol  succinate (TOPROL -XL) 25  MG 24 hr tablet TAKE 1 TABLET BY MOUTH DAILY. (Patient not taking: Reported on 09/10/2023) 30 tablet 5   No facility-administered medications prior to visit.    Allergies  Allergen Reactions   Cimetidine Hives    Review of Systems  Constitutional:  Negative for chills, diaphoresis, fatigue and fever.  HENT:  Positive for congestion, ear pain and postnasal drip. Negative for sinus pain and sore throat.   Eyes: Negative.   Respiratory:  Negative for cough and shortness of breath.   Cardiovascular:  Negative for chest pain.  Gastrointestinal:  Negative for abdominal pain, constipation, diarrhea, nausea and vomiting.  Endocrine: Negative.   Genitourinary:  Negative for dysuria,  frequency and urgency.  Musculoskeletal:  Negative for arthralgias.  Allergic/Immunologic: Negative.   Neurological:  Positive for dizziness and headaches. Negative for weakness and light-headedness.  Hematological: Negative.   Psychiatric/Behavioral:  Negative for dysphoric mood. The patient is not nervous/anxious.        Objective:        09/10/2023   11:16 AM 08/13/2023    8:45 AM 07/27/2023    2:50 PM  Vitals with BMI  Height 5' 1    Weight 130 lbs    BMI 24.58    Systolic 130 130 869  Diastolic 72 68 61  Pulse 70 73 65    No data found.   Physical Exam Constitutional:      General: She is not in acute distress.    Appearance: Normal appearance. She is ill-appearing.  HENT:     Right Ear: There is impacted cerumen.     Left Ear: Tympanic membrane is not erythematous.     Nose:     Right Sinus: Frontal sinus tenderness present. No maxillary sinus tenderness.     Left Sinus: Frontal sinus tenderness present. No maxillary sinus tenderness.     Mouth/Throat:     Pharynx: No posterior oropharyngeal erythema.     Tonsils: No tonsillar abscesses.  Eyes:     Conjunctiva/sclera: Conjunctivae normal.  Neck:     Vascular: No carotid bruit.  Cardiovascular:     Rate and Rhythm: Normal rate and  regular rhythm.     Heart sounds: Normal heart sounds. No murmur heard. Pulmonary:     Effort: Pulmonary effort is normal.     Breath sounds: Normal breath sounds. No wheezing or rhonchi.  Abdominal:     General: Bowel sounds are normal.     Palpations: Abdomen is soft.     Tenderness: There is no abdominal tenderness.  Musculoskeletal:        General: Normal range of motion.  Skin:    General: Skin is warm.  Neurological:     Mental Status: She is alert. Mental status is at baseline.  Psychiatric:        Mood and Affect: Mood normal.        Behavior: Behavior normal.     Health Maintenance Due  Topic Date Due   Medicare Annual Wellness (AWV)  12/16/2020   INFLUENZA VACCINE  08/17/2023    There are no preventive care reminders to display for this patient.   Lab Results  Component Value Date   TSH 11.800 (H) 08/23/2023   Lab Results  Component Value Date   WBC 6.1 07/23/2023   HGB 13.8 07/23/2023   HCT 43.8 07/23/2023   MCV 96 07/23/2023   PLT 239 07/23/2023   Lab Results  Component Value Date   NA 144 07/23/2023   K 4.4 07/23/2023   CO2 23 07/23/2023   GLUCOSE 95 07/23/2023   BUN 14 07/23/2023   CREATININE 0.90 07/23/2023   BILITOT 0.4 07/23/2023   ALKPHOS 66 07/23/2023   AST 26 07/23/2023   ALT 22 07/23/2023   PROT 6.9 07/23/2023   ALBUMIN 4.6 07/23/2023   CALCIUM 9.5 07/23/2023   EGFR 65 07/23/2023   Lab Results  Component Value Date   CHOL 153 07/23/2023   Lab Results  Component Value Date   HDL 51 07/23/2023   Lab Results  Component Value Date   LDLCALC 79 07/23/2023   Lab Results  Component Value Date   TRIG 132 07/23/2023   Lab  Results  Component Value Date   CHOLHDL 3.0 07/23/2023   Lab Results  Component Value Date   HGBA1C 6.2 (H) 07/23/2023       Assessment & Plan:  Acute non-recurrent frontal sinusitis Assessment & Plan: Acute - since 07/04/23 started at the beach Sinus symptoms persist despite Flonase . Montelukast  may  contribute to some of her symptoms. - Amoxicillin  875 mg by mouth TWICE A DAY for 7 days - Discontinue montelukast  (Singulair ). - Consider over-the-counter antihistamine such as cetirizine (Zyrtec). - Increase fluid intake, rest and practice good hand hygeine - Throw away your toothbrush and start using a new one  Follow-up if symptoms persist or do not improve over the next 7 days, possible referral to ENT for evaluation     Orders: -     Amoxicillin ; Take 1 tablet (875 mg total) by mouth 2 (two) times daily for 7 days.  Dispense: 14 tablet; Refill: 0  Non-seasonal allergic rhinitis, unspecified trigger Assessment & Plan: Allergic rhinitis Persistent symptoms not controlled with Flonase . Possible eustachian tube dysfunction. - Discontinue montelukast  (Singulair ). - Consider over-the-counter antihistamine such as cetirizine (Zyrtec).      Meds ordered this encounter  Medications   amoxicillin  (AMOXIL ) 875 MG tablet    Sig: Take 1 tablet (875 mg total) by mouth 2 (two) times daily for 7 days.    Dispense:  14 tablet    Refill:  0    No orders of the defined types were placed in this encounter.   Follow-up: Return if symptoms worsen or fail to improve, for scheduled appointment with Ginnie Moats, PA.  An After Visit Summary was printed and given to the patient.  Harrie Cedar, FNP [facility] [phone]

## 2023-09-10 NOTE — Telephone Encounter (Signed)
 FYI Only or Action Required?: FYI only for provider.  Patient was last seen in primary care on 07/23/2023 by Nicholaus Credit, PA-C.  Called Nurse Triage reporting Otalgia and Sore Throat.  Symptoms began a week ago.  Interventions attempted: Rest, hydration, or home remedies.  Symptoms are: unchanged.  Triage Disposition: See HCP Within 4 Hours (Or PCP Triage)  Patient/caregiver understands and will follow disposition?: Yes      Message from Janeecia G sent at 09/10/2023  9:15 AM EDT   pt got a ear ache and she's stopped up with a sore throat.please follow up with pt she wants to know if she can come in today and get a shot for it   Reason for Disposition  Diabetes mellitus or weak immune system (e.g., HIV positive, cancer chemo, splenectomy, organ transplant, chronic steroids)  Answer Assessment - Initial Assessment Questions 1. LOCATION: Which ear is involved?     Bilateral 2. ONSET: When did the ear pain start?      One week 3. SEVERITY: How bad is the pain?  (Scale 1-10; mild, moderate or severe)     mild 4. URI SYMPTOMS: Do you have a runny nose or cough?     Sore throat  5. FEVER: Do you have a fever? If Yes, ask: What is your temperature, how was it measured, and when did it start?     Denies 6. CAUSE: Have you been swimming recently?, How often do you use Q-TIPS?, Have you had any recent air travel or scuba diving?      7. OTHER SYMPTOMS: Do you have any other symptoms? (e.g., decreased hearing, dizziness, headache, stiff neck, vomiting)  Protocols used: [place]

## 2023-10-08 IMAGING — MG MM DIGITAL SCREENING BILAT W/ TOMO AND CAD
8 series · 9 of 24 positions shown · non-contrast
Comparison: Previous exam(s).

CLINICAL DATA: Screening.

EXAM:
DIGITAL SCREENING BILATERAL MAMMOGRAM WITH TOMOSYNTHESIS AND CAD
TECHNIQUE: Bilateral screening digital craniocaudal and mediolateral oblique
mammograms were obtained. Bilateral screening digital breast
tomosynthesis was performed. The images were evaluated with
computer-aided detection.

[R MLO synth-2D]
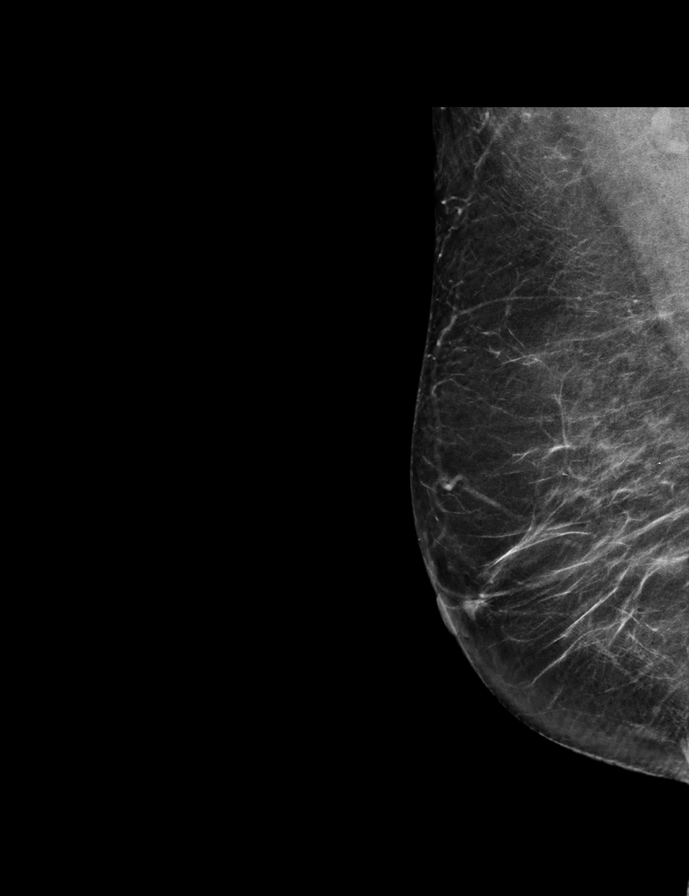

[L MLO synth-2D]
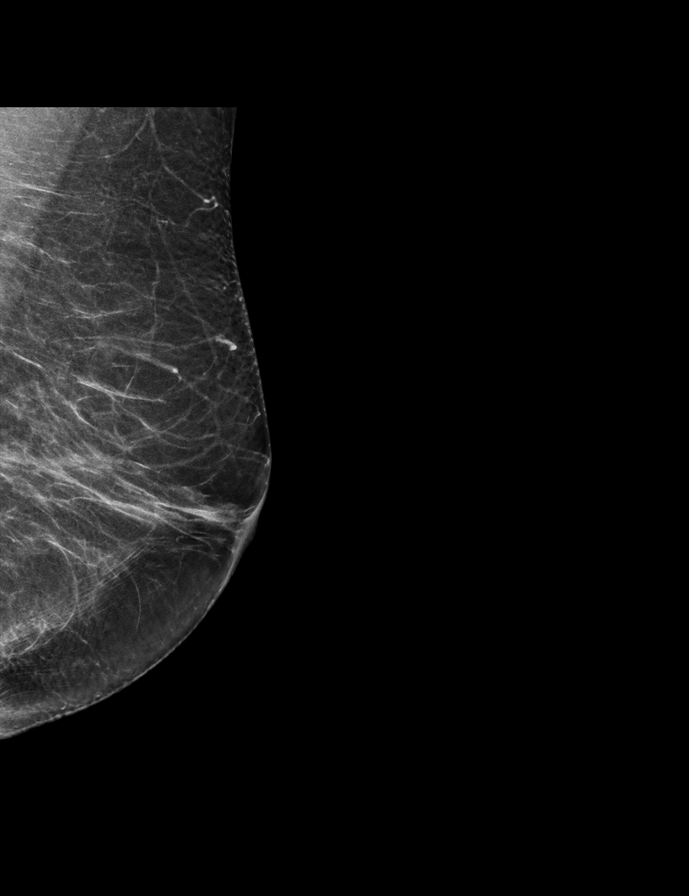

[R CC synth-2D]
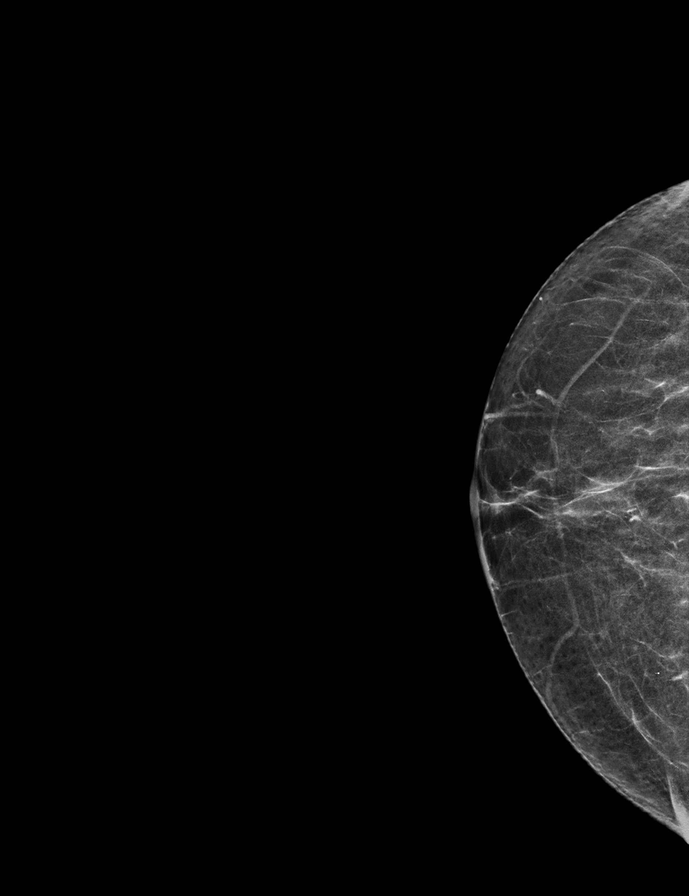

[L CC synth-2D]
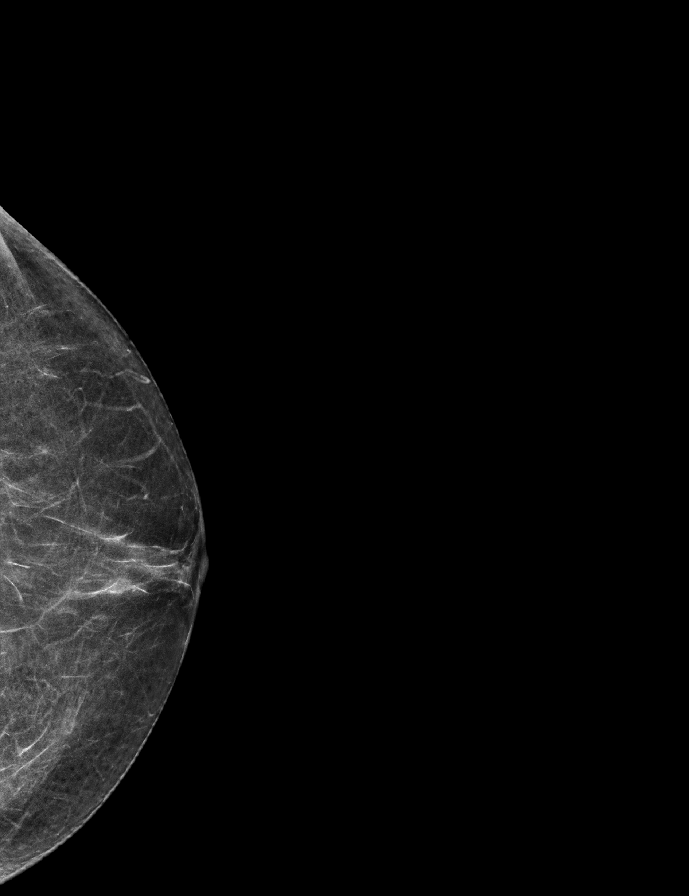

[L MLO tomo · 2 of 57 frames shown]
[frame 19/57]
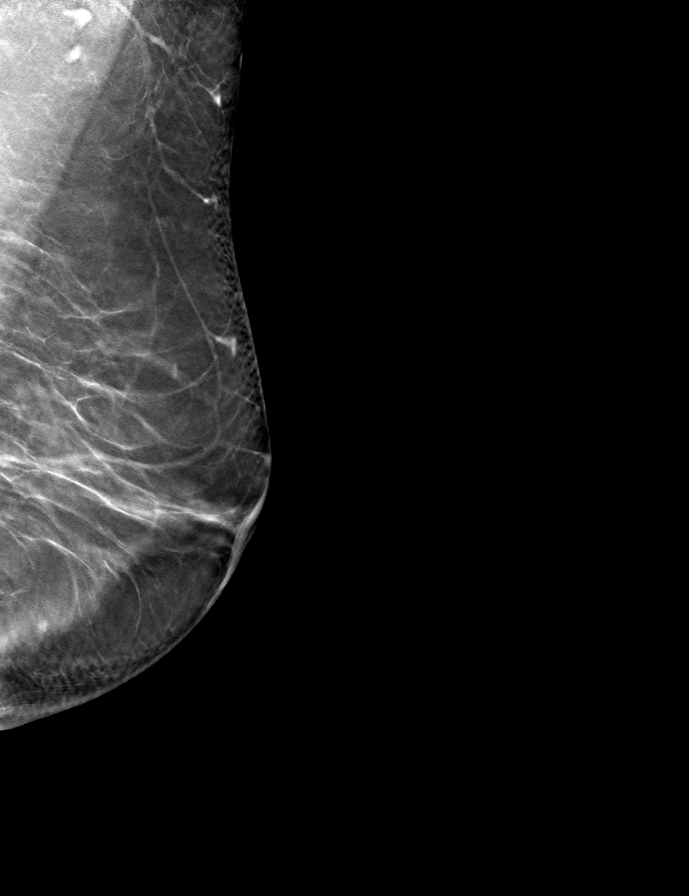
[frame 29/57]
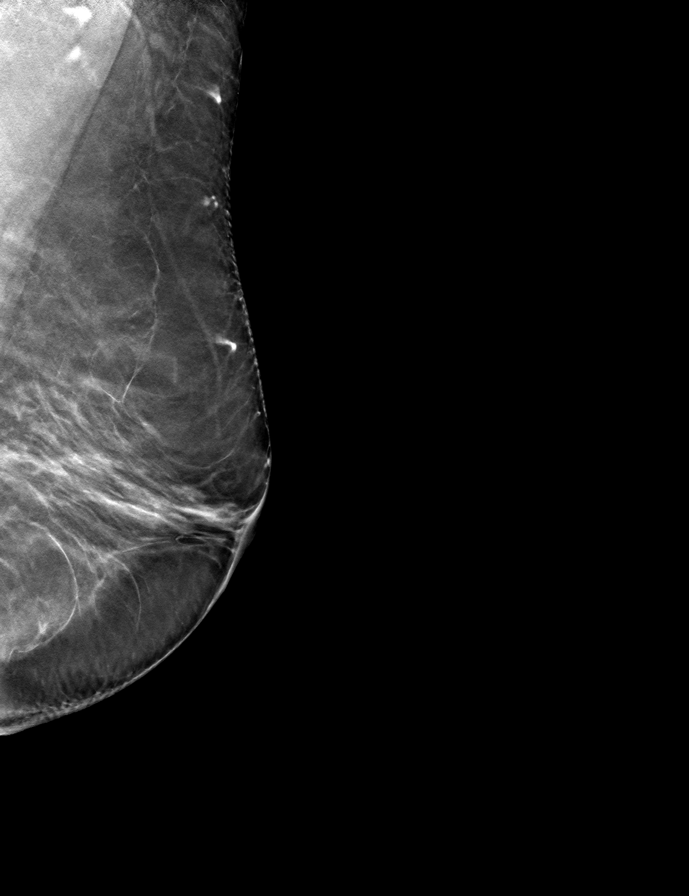

[R MLO tomo · tomo slice 31/61.0]
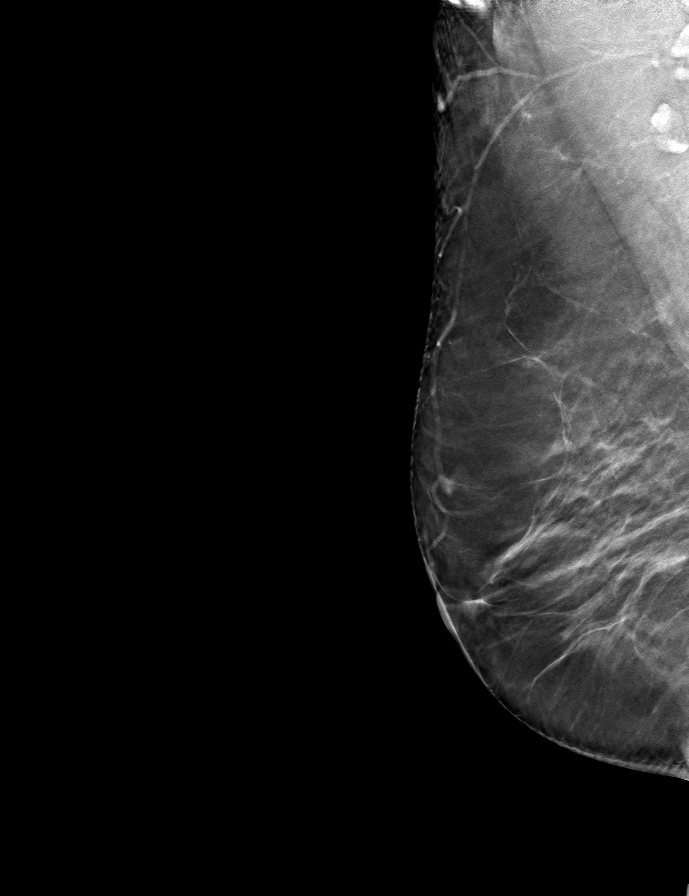

[R CC tomo · tomo slice 27/53.0]
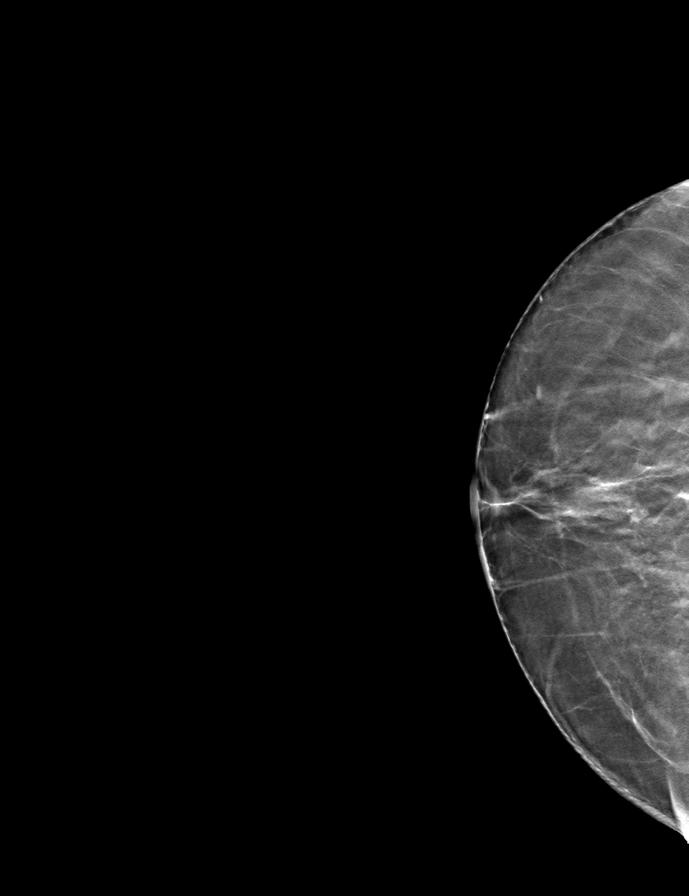

[L CC tomo · tomo slice 27/53.0]
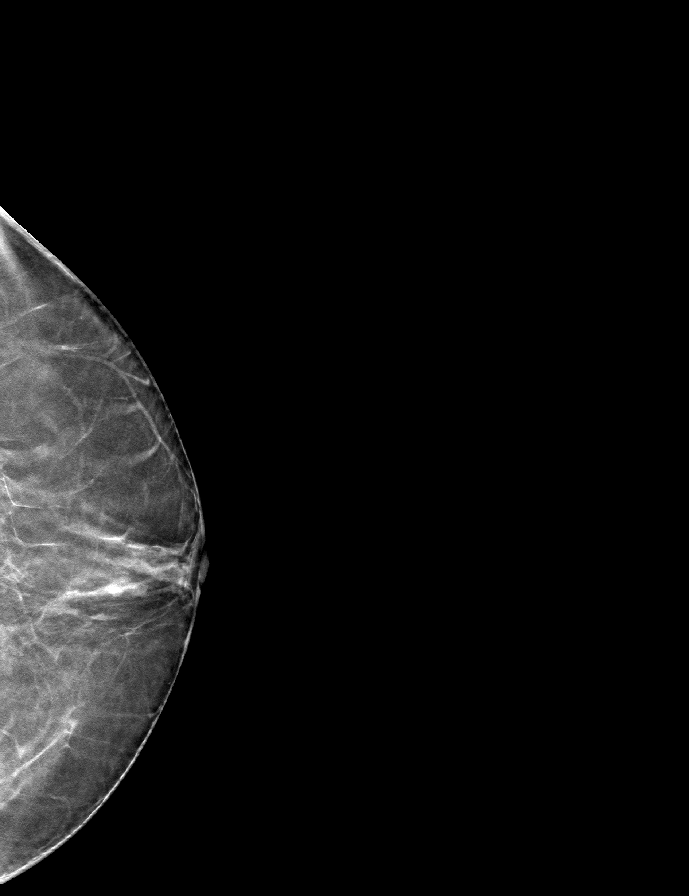

[9 of 24 positions shown; findings below may reference images not displayed]

ACR Breast Density Category b: There are scattered areas of
fibroglandular density.
FINDINGS: There are no findings suspicious for malignancy.
IMPRESSION: No mammographic evidence of malignancy. A result letter of this
screening mammogram will be mailed directly to the patient.

RECOMMENDATION:
Screening mammogram in one year. (Code:51-O-LD2)

BI-RADS CATEGORY  1: Negative.

## 2023-10-15 ENCOUNTER — Other Ambulatory Visit: Payer: Self-pay | Admitting: Physician Assistant

## 2023-10-15 DIAGNOSIS — E039 Hypothyroidism, unspecified: Secondary | ICD-10-CM

## 2023-10-19 ENCOUNTER — Ambulatory Visit (HOSPITAL_BASED_OUTPATIENT_CLINIC_OR_DEPARTMENT_OTHER)
Admission: EM | Admit: 2023-10-19 | Discharge: 2023-10-19 | Disposition: A | Discharge status: Home / Self Care | Attending: Family Medicine | Admitting: Family Medicine

## 2023-10-19 ENCOUNTER — Encounter (HOSPITAL_BASED_OUTPATIENT_CLINIC_OR_DEPARTMENT_OTHER): Payer: Self-pay | Admitting: Emergency Medicine

## 2023-10-19 DIAGNOSIS — M26609 Unspecified temporomandibular joint disorder, unspecified side: Secondary | ICD-10-CM

## 2023-10-19 DIAGNOSIS — H9313 Tinnitus, bilateral: Secondary | ICD-10-CM

## 2023-10-19 DIAGNOSIS — R490 Dysphonia: Secondary | ICD-10-CM

## 2023-10-19 DIAGNOSIS — J029 Acute pharyngitis, unspecified: Secondary | ICD-10-CM

## 2023-10-19 MED ORDER — PREDNISONE 20 MG PO TABS
20.0000 mg | ORAL_TABLET | Freq: Every day | ORAL | 0 refills | Status: DC
Start: 2023-10-19 — End: 2023-10-24

## 2023-10-19 NOTE — Discharge Instructions (Addendum)
 TMJ dysfunction bilaterally with bilateral ear clicks, sore throat and hoarse voice: Exam is essentially normal except for there is a jaw click with TMJ movement.  Provided prednisone , 20 mg, 1 pill daily for 5 days.  Patient does not chew ice, chew gum and is not aware of grinding her teeth.  Exam was negative for any problems with her throat and no swollen glands in her neck.  Encouraged to follow-up with primary care.  The patient may need to see ENT (regarding TMJ, hearing ear clicks, chronic sore throat, chronic hoarse voice) or see her dentist for further evaluation of any tooth grinding.  Follow-up here as needed.  Contact a health care provider if: You have trouble eating. You have new or worsening symptoms. Get help right away if: Your jaw locks.

## 2023-10-19 NOTE — ED Triage Notes (Signed)
 Pt c/o hearing a clicking noise in both ears when she turns her head.   Also c/o sore throat and hoarseness   Pt st's she has had these symptoms off and on x's 3 months or longer

## 2023-10-19 NOTE — ED Provider Notes (Signed)
 [facility]    CSN: [id number] Arrival date & time: 10/19/23  1735      History   Chief Complaint Chief Complaint  Patient presents with   Ear problems   Sore Throat    HPI Kelly Horn is a 78 y.o. female.   78 year old female who reports hearing a clicking noise in both ears when she turns her head.  This has been present since April 2025.  She first noticed it in April while she was [place] the beach.  She went to an urgent care at that time and was told that she had fluid in her ear tubes and was put on medication for that.  Symptoms improved some but did not completely resolve.  This has been a persistent clicking intermittently since April.  It is not worse but it is not better and it is bothering her so she wanted to come get checked.  She has also had an intermittent sore throat and hoarse voice since April 2025.  She denies fever, cough, body aches, chills, nausea, vomiting, constipation, diarrhea.  She denies any acute or new symptoms.   Sore Throat Pertinent negatives include no chest pain, no abdominal pain and no shortness of breath.    Past Medical History:  Diagnosis Date   Abnormal glucose level 06/15/2022   Acquired hypothyroidism 12/20/2022   Acute sinusitis 11/13/2019   Atypical chest pain 12/20/2022   BMI 24.0-24.9, adult 06/03/2019   Dizziness 07/23/2023   Elevated BP without diagnosis of hypertension 01/22/2023   Exertional dyspnea 12/20/2022   GAD (generalized anxiety disorder) 01/22/2023   GERD with esophagitis    Hair loss 07/23/2023   Medicare annual wellness visit, subsequent 12/17/2019   Menopause present 06/03/2019   Mixed hyperlipidemia    Osteopenia 12/20/2022   Other chest pain 01/22/2023   Other fatigue 12/20/2022   Other specified hypothyroidism    Prediabetes 10/08/2020   Primary hypertension 07/23/2023   Primary hypothyroidism    Type 2 diabetes mellitus with other specified complication (HCC)    Vitamin D  deficiency  12/20/2022    Patient Active Problem List   Diagnosis Date Noted   Non-seasonal allergic rhinitis 09/10/2023   Other specified hypothyroidism    Type 2 diabetes mellitus with other specified complication (HCC)    Dizziness 07/23/2023   Primary hypertension 07/23/2023   Hair loss 07/23/2023   Other chest pain 01/22/2023   GAD (generalized anxiety disorder) 01/22/2023   Elevated BP without diagnosis of hypertension 01/22/2023   Atypical chest pain 12/20/2022   Acquired hypothyroidism 12/20/2022   Vitamin D  deficiency 12/20/2022   Osteopenia 12/20/2022   Exertional dyspnea 12/20/2022   Other fatigue 12/20/2022   Abnormal glucose level 06/15/2022   Prediabetes 10/08/2020   Medicare annual wellness visit, subsequent 12/17/2019   Acute sinusitis 11/13/2019   Menopause present 06/03/2019   BMI 24.0-24.9, adult 06/03/2019   Mixed hyperlipidemia    Primary hypothyroidism    GERD with esophagitis     Past Surgical History:  Procedure Laterality Date   CERVICAL DISC SURGERY     PARTIAL HYSTERECTOMY      OB History   No obstetric history on file.      Home Medications    Prior to Admission medications   Medication Sig Start Date End Date Taking? Authorizing Provider  predniSONE  (DELTASONE ) 20 MG tablet Take 1 tablet (20 mg total) by mouth daily with breakfast for 5 days. 10/19/23 10/24/23 Yes Ival Domino, FNP  Calcium Carbonate-Vit D-Min (  CALCIUM 1200 PO) Take 1,200 mg by mouth daily.    [provider]  cetirizine (ZYRTEC) 10 MG tablet Take 10 mg by mouth daily.    [provider]  escitalopram  (LEXAPRO ) 10 MG tablet TAKE 1 TABLET BY MOUTH DAILY. Patient not taking: Reported on 09/10/2023 07/23/23   Nicholaus Credit, PA-C  fluticasone  (FLONASE ) 50 MCG/ACT nasal spray INAHEL 2 SPRAYS INTO BOTH NOSTRILS DAILY. 12/26/22   Nicholaus Credit, PA-C  levothyroxine  (SYNTHROID ) 75 MCG tablet TAKE 1 TABLET BY MOUTH DAILY. 10/15/23   Nicholaus Credit, PA-C  metoprolol  succinate  (TOPROL -XL) 25 MG 24 hr tablet TAKE 1 TABLET BY MOUTH DAILY. Patient not taking: Reported on 09/10/2023 03/20/23   Nicholaus Credit, PA-C  montelukast  (SINGULAIR ) 10 MG tablet Take 1 tablet (10 mg total) by mouth at bedtime. 10 days left as of 07/23/2023 Patient not taking: Reported on 10/19/2023 07/27/23   Carlin Delon BROCKS, NP  Multiple Vitamin (MULTI VITAMIN DAILY PO) Take 1 tablet by mouth daily.    [provider]  omeprazole  (PRILOSEC) 40 MG capsule Take 40 mg by mouth daily. 03/27/23   [provider]  simvastatin  (ZOCOR ) 20 MG tablet TAKE 1 TABLET BY MOUTH DAILY 08/13/23   Nicholaus Credit, PA-C    Family History Family History  Problem Relation Age of Onset   Cancer Mother    Heart attack Father    Diabetes Other    Arthritis Other     Social History Social History   Tobacco Use   Smoking status: Never   Smokeless tobacco: Never  Vaping Use   Vaping status: Never Used  Substance Use Topics   Alcohol use: Never   Drug use: Never     Allergies   Cimetidine   Review of Systems Review of Systems  Constitutional:  Negative for chills and fever.  HENT:  Positive for sore throat. Negative for ear pain (No pain but she has a clicking noise in her ear when she moves her head.).   Eyes:  Negative for pain and visual disturbance.  Respiratory:  Negative for cough and shortness of breath.   Cardiovascular:  Negative for chest pain and palpitations.  Gastrointestinal:  Negative for abdominal pain, constipation, diarrhea, nausea and vomiting.  Genitourinary:  Negative for dysuria and hematuria.  Musculoskeletal:  Negative for arthralgias and back pain.  Skin:  Negative for color change and rash.  Neurological:  Negative for seizures and syncope.  All other systems reviewed and are negative.    Physical Exam Triage Vital Signs ED Triage Vitals [10/19/23 1749]  Encounter Vitals Group     BP (!) 119/53     Girls Systolic BP Percentile      Girls Diastolic BP  Percentile      Boys Systolic BP Percentile      Boys Diastolic BP Percentile      Pulse Rate 72     Resp 18     Temp 98.2 F (36.8 C)     Temp Source Oral     SpO2 97 %     Weight      Height      Head Circumference      Peak Flow      Pain Score      Pain Loc      Pain Education      Exclude from Growth Chart    No data found.  Updated Vital Signs BP (!) 119/53 (BP Location: Right Arm)   Pulse 72  Temp 98.2 F (36.8 C) (Oral)   Resp 18   SpO2 97%   Visual Acuity Right Eye Distance:   Left Eye Distance:   Bilateral Distance:    Right Eye Near:   Left Eye Near:    Bilateral Near:     Physical Exam Vitals and nursing note reviewed.  Constitutional:      General: She is not in acute distress.    Appearance: She is well-developed. She is not ill-appearing or toxic-appearing.  HENT:     Head: Normocephalic and atraumatic.     Jaw: Tenderness (Lateral tenderness at the TMJ with the jaw click bilaterally) and pain on movement present.     Right Ear: Hearing, tympanic membrane, ear canal and external ear normal.     Left Ear: Hearing, tympanic membrane, ear canal and external ear normal.     Nose: No congestion or rhinorrhea.     Right Sinus: No maxillary sinus tenderness or frontal sinus tenderness.     Left Sinus: No maxillary sinus tenderness or frontal sinus tenderness.     Mouth/Throat:     Lips: Pink.     Mouth: Mucous membranes are moist.     Pharynx: Uvula midline. No oropharyngeal exudate or posterior oropharyngeal erythema.     Tonsils: No tonsillar exudate.  Eyes:     Conjunctiva/sclera: Conjunctivae normal.     Pupils: Pupils are equal, round, and reactive to light.  Cardiovascular:     Rate and Rhythm: Normal rate and regular rhythm.     Heart sounds: S1 normal and S2 normal. No murmur heard. Pulmonary:     Effort: Pulmonary effort is normal. No respiratory distress.     Breath sounds: Normal breath sounds. No decreased breath sounds, wheezing,  rhonchi or rales.  Abdominal:     General: Bowel sounds are normal.     Palpations: Abdomen is soft.     Tenderness: There is no abdominal tenderness.  Musculoskeletal:        General: No swelling.     Cervical back: Neck supple.  Lymphadenopathy:     Head:     Right side of head: No submental, submandibular, tonsillar, preauricular or posterior auricular adenopathy.     Left side of head: No submental, submandibular, tonsillar, preauricular or posterior auricular adenopathy.     Cervical: No cervical adenopathy.     Right cervical: No superficial cervical adenopathy.    Left cervical: No superficial cervical adenopathy.  Skin:    General: Skin is warm and dry.     Capillary Refill: Capillary refill takes less than 2 seconds.     Findings: No rash.  Neurological:     Mental Status: She is alert and oriented to person, place, and time.  Psychiatric:        Mood and Affect: Mood normal.      UC Treatments / Results  Labs (all labs ordered are listed, but only abnormal results are displayed) Labs Reviewed - No data to display  EKG   Radiology No results found.  Procedures Procedures (including critical care time)  Medications Ordered in UC Medications - No data to display  Initial Impression / Assessment and Plan / UC Course  I have reviewed the triage vital signs and the nursing notes.  Pertinent labs & imaging results that were available during my care of the patient were reviewed by me and considered in my medical decision making (see chart for details).  Plan of Care: TMJ dysfunction bilaterally with  bilateral ear clicks, sore throat and hoarse voice: Exam is essentially normal except for there is a jaw click with TMJ movement.  Provided prednisone , 20 mg, 1 pill daily for 5 days.  Patient does not chew ice, chew gum and is not aware of grinding her teeth.  Exam was negative for any problems with her throat and no swollen glands in her neck.  Encouraged to  follow-up with primary care.  The patient may need to see ENT (regarding TMJ, hearing ear clicks, chronic sore throat, chronic hoarse voice) or see her dentist for further evaluation of any tooth grinding.  Follow-up here as needed.  I reviewed the plan of care with the patient and/or the patient's guardian.  The patient and/or guardian had time to ask questions and acknowledged that the questions were answered.  I provided instruction on symptoms or reasons to return here or to go to an ER, if symptoms/condition did not improve, worsened or if new symptoms occurred.  Final Clinical Impressions(s) / UC Diagnoses   Final diagnoses:  Ear clicks, bilateral  Sore throat  Hoarse voice quality  TMJ dysfunction     Discharge Instructions      TMJ dysfunction bilaterally with bilateral ear clicks, sore throat and hoarse voice: Exam is essentially normal except for there is a jaw click with TMJ movement.  Provided prednisone , 20 mg, 1 pill daily for 5 days.  Patient does not chew ice, chew gum and is not aware of grinding her teeth.  Exam was negative for any problems with her throat and no swollen glands in her neck.  Encouraged to follow-up with primary care.  The patient may need to see ENT (regarding TMJ, hearing ear clicks, chronic sore throat, chronic hoarse voice) or see her dentist for further evaluation of any tooth grinding.  Follow-up here as needed.  Contact a health care provider if: You have trouble eating. You have new or worsening symptoms. Get help right away if: Your jaw locks.     ED Prescriptions     Medication Sig Dispense Auth. Provider   predniSONE  (DELTASONE ) 20 MG tablet Take 1 tablet (20 mg total) by mouth daily with breakfast for 5 days. 5 tablet Tayton Decaire, FNP      PDMP not reviewed this encounter.   Ival Domino, FNP 10/19/23 1816

## 2023-10-24 ENCOUNTER — Ambulatory Visit: Admitting: Physician Assistant

## 2023-10-24 ENCOUNTER — Encounter: Payer: Self-pay | Admitting: Physician Assistant

## 2023-10-24 VITALS — BP 130/74 | HR 98 | Temp 98.5°F | Resp 18 | Ht 61.0 in | Wt 125.0 lb

## 2023-10-24 DIAGNOSIS — H918X3 Other specified hearing loss, bilateral: Secondary | ICD-10-CM | POA: Diagnosis not present

## 2023-10-24 DIAGNOSIS — E782 Mixed hyperlipidemia: Secondary | ICD-10-CM

## 2023-10-24 DIAGNOSIS — I10 Essential (primary) hypertension: Secondary | ICD-10-CM

## 2023-10-24 DIAGNOSIS — H6993 Unspecified Eustachian tube disorder, bilateral: Secondary | ICD-10-CM

## 2023-10-24 DIAGNOSIS — M8580 Other specified disorders of bone density and structure, unspecified site: Secondary | ICD-10-CM | POA: Diagnosis not present

## 2023-10-24 DIAGNOSIS — E039 Hypothyroidism, unspecified: Secondary | ICD-10-CM | POA: Diagnosis not present

## 2023-10-24 DIAGNOSIS — E559 Vitamin D deficiency, unspecified: Secondary | ICD-10-CM

## 2023-10-24 DIAGNOSIS — K2100 Gastro-esophageal reflux disease with esophagitis, without bleeding: Secondary | ICD-10-CM | POA: Diagnosis not present

## 2023-10-24 DIAGNOSIS — R7303 Prediabetes: Secondary | ICD-10-CM

## 2023-10-24 NOTE — Progress Notes (Signed)
 Subjective:  Patient ID: Kelly Horn, female    DOB: Sep 05, 1945  Age: 78 y.o. MRN: [id number]  Chief Complaint  Patient presents with   Medical Management of Chronic Issues   HPI  Mixed hyperlipidemia  Pt presents with hyperlipidemia. Compliance with treatment has been good The patient is compliant with medications, maintains a low cholesterol diet , follows up as directed , and maintains an exercise regimen . The patient denies experiencing any hypercholesterolemia related symptoms. Currently taking zocor  20mg  qd  Pt with history of GERD - stable on prilosec 40mg  qd  Pt with history of hypothyroidism - taking synthroid  75 mcg qd - voices no problems or concerns and is due for labwork  Pt with history of osteopenia - is on vit D and calcium supplements - is up to date with dexa scan Due to check vit D level  Pt with mild anxiety - she had been doing well onlexapro for several months but a few weeks ago states she felt very anxious and shaky and decided to stop the medication thinking that was a side effect.  She says she is anxious almost daily but does not want any other medication at this time for her symptoms  Pt has seen cardiology for evaluation of palpitations - she had a normal stress test and what appears to be a stable event monitor.  She was told to stop metoprolol  but to monitor blood pressure - BP is good today at 130/74 - denies chest pain or dyspnea  Pt complains of bilateral ear clicking and mild hearing loss.  She went to Urgent Care and was told she had TMJ (despite not having jaw pain or tenderness to palpation or with chewing) and was given prednisone .  States it has helped somewhat with the clicking she was having.  Pt also says she feels 'bubbles' in her ears at times - recommend referral to ENT    10/24/2023    9:15 AM 07/23/2023   10:35 AM 04/24/2023    9:28 AM 02/28/2023   11:19 AM 12/20/2022    9:49 AM  Depression screen PHQ 2/9  Decreased Interest  0 1 1 2    Down, Depressed, Hopeless 0 0 1 1 1   PHQ - 2 Score 0 0 2 2 3   Altered sleeping 0  0 0 0  Tired, decreased energy 1  1 1 1   Change in appetite 0  0 0 2  Feeling bad or failure about yourself  0  0 0 0  Trouble concentrating 0  0 0 0  Moving slowly or fidgety/restless 0  0 0 0  Suicidal thoughts 0  0 0 0  PHQ-9 Score 1  3 3 6   Difficult doing work/chores Not difficult at all   Not difficult at all Not difficult at all        06/15/2022    1:41 PM 12/20/2022    9:48 AM 02/28/2023   11:19 AM 04/24/2023    9:28 AM 07/23/2023   10:35 AM  Fall Risk  Falls in the past year? 0 0 0 0 0  Was there an injury with Fall? 0 0 0 0 0  Fall Risk Category Calculator 0 0 0 0 0  Patient at Risk for Falls Due to No Fall Risks No Fall Risks No Fall Risks No Fall Risks No Fall Risks  Fall risk Follow up Falls evaluation completed Falls evaluation completed Falls evaluation completed Falls evaluation completed      CONSTITUTIONAL:  Negative for chills, fatigue, fever, unintentional weight gain and unintentional weight loss.  E/N/T: see HPI CARDIOVASCULAR: Negative for chest pain, dizziness, palpitations and pedal edema.  RESPIRATORY: Negative for recent cough and dyspnea.  GASTROINTESTINAL: Negative for abdominal pain, acid reflux symptoms, constipation, diarrhea, nausea and vomiting.  MSK: Negative for arthralgias and myalgias.  INTEGUMENTARY: Negative for rash.  NEUROLOGICAL: Negative for dizziness and headaches.  PSYCHIATRIC: see HPI      Current Outpatient Medications:    Calcium Carbonate-Vit D-Min (CALCIUM 1200 PO), Take 1,200 mg by mouth daily., Disp: , Rfl:    cetirizine (ZYRTEC) 10 MG tablet, Take 10 mg by mouth daily., Disp: , Rfl:    finasteride (PROSCAR) 5 MG tablet, Take 5 mg by mouth daily., Disp: , Rfl:    fluticasone  (FLONASE ) 50 MCG/ACT nasal spray, INAHEL 2 SPRAYS INTO BOTH NOSTRILS DAILY., Disp: 16 g, Rfl: 5   levothyroxine  (SYNTHROID ) 75 MCG tablet, TAKE 1 TABLET BY MOUTH DAILY.,  Disp: 90 tablet, Rfl: 0   Multiple Vitamin (MULTI VITAMIN DAILY PO), Take 1 tablet by mouth daily., Disp: , Rfl:    omeprazole  (PRILOSEC) 40 MG capsule, Take 40 mg by mouth daily., Disp: , Rfl:    simvastatin  (ZOCOR ) 20 MG tablet, TAKE 1 TABLET BY MOUTH DAILY, Disp: 90 tablet, Rfl: 0  Past Medical History:  Diagnosis Date   Abnormal glucose level 06/15/2022   Acquired hypothyroidism 12/20/2022   Acute sinusitis 11/13/2019   Atypical chest pain 12/20/2022   BMI 24.0-24.9, adult 06/03/2019   Dizziness 07/23/2023   Elevated BP without diagnosis of hypertension 01/22/2023   Exertional dyspnea 12/20/2022   GAD (generalized anxiety disorder) 01/22/2023   GERD with esophagitis    Hair loss 07/23/2023   Medicare annual wellness visit, subsequent 12/17/2019   Menopause present 06/03/2019   Mixed hyperlipidemia    Osteopenia 12/20/2022   Other chest pain 01/22/2023   Other fatigue 12/20/2022   Other specified hypothyroidism    Prediabetes 10/08/2020   Primary hypertension 07/23/2023   Primary hypothyroidism    Type 2 diabetes mellitus with other specified complication (HCC)    Vitamin D  deficiency 12/20/2022   Objective:  PHYSICAL EXAM:   VS: BP 130/74   Pulse 98   Temp 98.5 F (36.9 C) (Temporal)   Resp 18   Ht 5' 1 (1.549 m)   Wt 125 lb (56.7 kg)   SpO2 98%   BMI 23.62 kg/m   GEN: Well nourished, well developed, in no acute distress  HEENT: normal external ears and nose - normal external auditory canals but TMS are slightly retracted with mild fluid accumulation noted - hearing grossly normal - normal nasal mucosa and septum - Lips, Teeth and Gums - normal  Oropharynx - normal mucosa, palate, and posterior pharynx Cardiac: RRR; no murmurs, rubs, or gallops,no edema -  Respiratory:  normal respiratory rate and pattern with no distress - normal breath sounds with no rales, rhonchi, wheezes or rubs Skin: warm and dry, no rash  Neuro:  Alert and Oriented x 3, - CN II-Xii  grossly intact Psych: euthymic mood, appropriate affect and demeanor   Assessment & Plan:    Prediabetes -     CBC with Differential/Platelet -     Comprehensive metabolic panel -     Hemoglobin A1c Watch diet Mixed hyperlipidemia -     Comprehensive metabolic panel -     Lipid panel Continue med Gastroesophageal reflux disease with esophagitis, unspecified whether hemorrhage Continue med Primary hypothyroidism -  TSH Continue med Vitamin D  deficiency -     VITAMIN D  25 Hydroxy (Vit-D Deficiency, Fractures)  Osteopenia, unspecified location Continue calcium and vit D   Hypertension Stable off medication at this time - will continue to monitor  ETD  Hearing loss Continue flonase  and zyrtec Recommend salt water gargles bid Refer to ENT for further evaluation  Follow-up: Return in about 6 months (around 04/23/2024) for chronic fasting follow-up.  An After Visit Summary was printed and given to the patient.  CAMIE JONELLE NICHOLAUS DEVONNA [facility] [phone]

## 2023-10-25 ENCOUNTER — Other Ambulatory Visit: Payer: Self-pay | Admitting: Physician Assistant

## 2023-10-25 ENCOUNTER — Ambulatory Visit: Payer: Self-pay | Admitting: Physician Assistant

## 2023-10-25 DIAGNOSIS — E039 Hypothyroidism, unspecified: Secondary | ICD-10-CM

## 2023-10-25 LAB — LIPID PANEL
Chol/HDL Ratio: 2.2 ratio (ref 0.0–4.4)
Cholesterol, Total: 143 mg/dL (ref 100–199)
HDL: 64 mg/dL (ref >39)
LDL Chol Calc (NIH): 59 mg/dL (ref 0–99)
Triglycerides: 110 mg/dL (ref 0–149)
VLDL Cholesterol Cal: 20 mg/dL (ref 5–40)

## 2023-10-25 LAB — VITAMIN D 25 HYDROXY (VIT D DEFICIENCY, FRACTURES): Vit D, 25-Hydroxy: 79.7 ng/mL (ref 30.0–100.0)

## 2023-10-25 LAB — COMPREHENSIVE METABOLIC PANEL WITH GFR
ALT: 22 IU/L (ref 0–32)
AST: 22 IU/L (ref 0–40)
Albumin: 4.7 g/dL (ref 3.8–4.8)
Alkaline Phosphatase: 72 IU/L (ref 49–135)
BUN/Creatinine Ratio: 23 (ref 12–28)
BUN: 16 mg/dL (ref 8–27)
Bilirubin Total: 0.4 mg/dL (ref 0.0–1.2)
CO2: 22 mmol/L (ref 20–29)
Calcium: 9.8 mg/dL (ref 8.7–10.3)
Chloride: 104 mmol/L (ref 96–106)
Creatinine, Ser: 0.69 mg/dL (ref 0.57–1.00)
Globulin, Total: 2.5 g/dL (ref 1.5–4.5)
Glucose: 120 mg/dL — ABNORMAL HIGH (ref 70–99)
Potassium: 3.9 mmol/L (ref 3.5–5.2)
Sodium: 143 mmol/L (ref 134–144)
Total Protein: 7.2 g/dL (ref 6.0–8.5)
eGFR: 88 mL/min/1.73 (ref >59)

## 2023-10-25 LAB — CBC WITH DIFFERENTIAL/PLATELET
Basophils Absolute: 0 x10E3/uL (ref 0.0–0.2)
Basos: 0 %
EOS (ABSOLUTE): 0 x10E3/uL (ref 0.0–0.4)
Eos: 0 %
Hematocrit: 40.9 % (ref 34.0–46.6)
Hemoglobin: 13.5 g/dL (ref 11.1–15.9)
Immature Grans (Abs): 0.1 x10E3/uL (ref 0.0–0.1)
Immature Granulocytes: 1 %
Lymphocytes Absolute: 1.3 x10E3/uL (ref 0.7–3.1)
Lymphs: 16 %
MCH: 30.8 pg (ref 26.6–33.0)
MCHC: 33 g/dL (ref 31.5–35.7)
MCV: 93 fL (ref 79–97)
Monocytes Absolute: 0.2 x10E3/uL (ref 0.1–0.9)
Monocytes: 3 %
Neutrophils Absolute: 6.6 x10E3/uL (ref 1.4–7.0)
Neutrophils: 80 %
Platelets: 227 x10E3/uL (ref 150–450)
RBC: 4.39 x10E6/uL (ref 3.77–5.28)
RDW: 12.4 % (ref 11.7–15.4)
WBC: 8.2 x10E3/uL (ref 3.4–10.8)

## 2023-10-25 LAB — HEMOGLOBIN A1C
Est. average glucose Bld gHb Est-mCnc: 126 mg/dL
Hgb A1c MFr Bld: 6 % — ABNORMAL HIGH (ref 4.8–5.6)

## 2023-10-25 LAB — TSH: TSH: 0.155 u[IU]/mL — ABNORMAL LOW (ref 0.450–4.500)

## 2023-10-25 MED ORDER — LEVOTHYROXINE SODIUM 50 MCG PO TABS: 50.0000 ug | ORAL_TABLET | Freq: Every day | ORAL | 3 refills | Status: DC

## 2023-11-15 DIAGNOSIS — H9190 Unspecified hearing loss, unspecified ear: Secondary | ICD-10-CM | POA: Diagnosis not present

## 2023-11-15 DIAGNOSIS — M26609 Unspecified temporomandibular joint disorder, unspecified side: Secondary | ICD-10-CM | POA: Diagnosis not present

## 2023-11-15 DIAGNOSIS — H6121 Impacted cerumen, right ear: Secondary | ICD-10-CM | POA: Diagnosis not present

## 2023-11-15 DIAGNOSIS — H9319 Tinnitus, unspecified ear: Secondary | ICD-10-CM | POA: Diagnosis not present

## 2023-11-20 DIAGNOSIS — H31092 Other chorioretinal scars, left eye: Secondary | ICD-10-CM | POA: Diagnosis not present

## 2023-11-20 DIAGNOSIS — Z961 Presence of intraocular lens: Secondary | ICD-10-CM | POA: Diagnosis not present

## 2023-11-20 DIAGNOSIS — H43813 Vitreous degeneration, bilateral: Secondary | ICD-10-CM | POA: Diagnosis not present

## 2023-11-20 DIAGNOSIS — H318 Other specified disorders of choroid: Secondary | ICD-10-CM | POA: Diagnosis not present

## 2023-11-26 ENCOUNTER — Other Ambulatory Visit (HOSPITAL_COMMUNITY): Payer: Self-pay | Admitting: Ophthalmology

## 2023-11-26 DIAGNOSIS — H318 Other specified disorders of choroid: Secondary | ICD-10-CM

## 2023-11-29 ENCOUNTER — Ambulatory Visit (INDEPENDENT_AMBULATORY_CARE_PROVIDER_SITE_OTHER)
Admission: RE | Admit: 2023-11-29 | Discharge: 2023-11-29 | Disposition: A | Discharge status: Home / Self Care | Source: Ambulatory Visit | Attending: Ophthalmology | Admitting: Ophthalmology

## 2023-11-29 DIAGNOSIS — H318 Other specified disorders of choroid: Secondary | ICD-10-CM

## 2023-11-29 DIAGNOSIS — G319 Degenerative disease of nervous system, unspecified: Secondary | ICD-10-CM | POA: Diagnosis not present

## 2023-11-29 DIAGNOSIS — H31009 Unspecified chorioretinal scars, unspecified eye: Secondary | ICD-10-CM | POA: Diagnosis not present

## 2023-11-29 DIAGNOSIS — H319 Unspecified disorder of choroid: Secondary | ICD-10-CM | POA: Diagnosis not present

## 2023-11-29 DIAGNOSIS — R9082 White matter disease, unspecified: Secondary | ICD-10-CM | POA: Diagnosis not present

## 2023-11-29 MED ORDER — GADOBUTROL 1 MMOL/ML IV SOLN
6.0000 mL | Freq: Once | INTRAVENOUS | Status: AC | PRN
  Administered 2023-11-29: 6 mL via INTRAVENOUS

## 2023-12-19 ENCOUNTER — Ambulatory Visit: Payer: Self-pay

## 2023-12-19 DIAGNOSIS — R002 Palpitations: Secondary | ICD-10-CM

## 2023-12-20 ENCOUNTER — Ambulatory Visit

## 2023-12-20 ENCOUNTER — Other Ambulatory Visit: Payer: Self-pay | Admitting: Physician Assistant

## 2023-12-20 ENCOUNTER — Other Ambulatory Visit

## 2023-12-20 ENCOUNTER — Ambulatory Visit: Admitting: Physician Assistant

## 2023-12-20 DIAGNOSIS — E039 Hypothyroidism, unspecified: Secondary | ICD-10-CM

## 2023-12-21 ENCOUNTER — Other Ambulatory Visit: Payer: Self-pay | Admitting: Physician Assistant

## 2023-12-21 ENCOUNTER — Ambulatory Visit: Payer: Self-pay | Admitting: Physician Assistant

## 2023-12-21 DIAGNOSIS — E038 Other specified hypothyroidism: Secondary | ICD-10-CM

## 2023-12-21 LAB — TSH: TSH: 9.06 u[IU]/mL — ABNORMAL HIGH (ref 0.450–4.500)

## 2023-12-21 MED ORDER — LEVOTHYROXINE SODIUM 75 MCG PO TABS: 75.0000 ug | ORAL_TABLET | Freq: Every day | ORAL | 0 refills | Status: DC

## 2023-12-27 ENCOUNTER — Telehealth: Payer: Self-pay

## 2023-12-27 NOTE — Telephone Encounter (Signed)
 Pt viewed monitor results on My Chart per Dr. Madireddy's note. Routed to PCP.

## 2024-01-25 ENCOUNTER — Other Ambulatory Visit: Payer: Self-pay | Admitting: Physician Assistant

## 2024-01-25 DIAGNOSIS — E782 Mixed hyperlipidemia: Secondary | ICD-10-CM

## 2024-02-18 ENCOUNTER — Other Ambulatory Visit

## 2024-02-19 ENCOUNTER — Other Ambulatory Visit: Payer: Self-pay | Admitting: Physician Assistant

## 2024-02-19 DIAGNOSIS — E782 Mixed hyperlipidemia: Secondary | ICD-10-CM

## 2024-02-19 DIAGNOSIS — E038 Other specified hypothyroidism: Secondary | ICD-10-CM

## 2024-02-20 ENCOUNTER — Other Ambulatory Visit

## 2024-02-20 DIAGNOSIS — E038 Other specified hypothyroidism: Secondary | ICD-10-CM

## 2024-02-21 ENCOUNTER — Ambulatory Visit: Payer: Self-pay | Admitting: Physician Assistant

## 2024-02-21 LAB — TSH: TSH: 0.434 u[IU]/mL — ABNORMAL LOW (ref 0.450–4.500)

## 2024-05-15 ENCOUNTER — Ambulatory Visit: Admitting: Physician Assistant
